# Patient Record
Sex: Male | Born: 1935 | Race: White | Hispanic: No | Marital: Married | Smoking: Never smoker
Health system: Southern US, Community
[De-identification: ages and names within clinical notes are randomized; demographics above are authoritative.]

## PROBLEM LIST (undated history)

## (undated) DIAGNOSIS — D649 Anemia, unspecified: Secondary | ICD-10-CM

## (undated) DIAGNOSIS — M79609 Pain in unspecified limb: Secondary | ICD-10-CM

## (undated) DIAGNOSIS — E039 Hypothyroidism, unspecified: Secondary | ICD-10-CM

## (undated) DIAGNOSIS — R197 Diarrhea, unspecified: Secondary | ICD-10-CM

## (undated) DIAGNOSIS — K5790 Diverticulosis of intestine, part unspecified, without perforation or abscess without bleeding: Secondary | ICD-10-CM

## (undated) DIAGNOSIS — Z8719 Personal history of other diseases of the digestive system: Secondary | ICD-10-CM

## (undated) DIAGNOSIS — N189 Chronic kidney disease, unspecified: Secondary | ICD-10-CM

## (undated) DIAGNOSIS — K4090 Unilateral inguinal hernia, without obstruction or gangrene, not specified as recurrent: Secondary | ICD-10-CM

## (undated) DIAGNOSIS — M1990 Unspecified osteoarthritis, unspecified site: Secondary | ICD-10-CM

## (undated) DIAGNOSIS — I319 Disease of pericardium, unspecified: Secondary | ICD-10-CM

## (undated) DIAGNOSIS — I10 Essential (primary) hypertension: Secondary | ICD-10-CM

## (undated) DIAGNOSIS — E079 Disorder of thyroid, unspecified: Secondary | ICD-10-CM

## (undated) DIAGNOSIS — C801 Malignant (primary) neoplasm, unspecified: Secondary | ICD-10-CM

## (undated) DIAGNOSIS — G8929 Other chronic pain: Secondary | ICD-10-CM

## (undated) DIAGNOSIS — E119 Type 2 diabetes mellitus without complications: Secondary | ICD-10-CM

## (undated) DIAGNOSIS — I639 Cerebral infarction, unspecified: Secondary | ICD-10-CM

## (undated) DIAGNOSIS — E785 Hyperlipidemia, unspecified: Secondary | ICD-10-CM

## (undated) HISTORY — DX: Type 2 diabetes mellitus without complications: E11.9

## (undated) HISTORY — DX: Unspecified osteoarthritis, unspecified site: M19.90

## (undated) HISTORY — PX: SPINE SURGERY: SHX786

## (undated) HISTORY — PX: HERNIA REPAIR: SHX51

## (undated) HISTORY — DX: Pain in unspecified limb: M79.609

## (undated) HISTORY — DX: Hyperlipidemia, unspecified: E78.5

## (undated) HISTORY — PX: CATARACT EXTRACTION: SUR2

## (undated) HISTORY — PX: COLONOSCOPY W/ POLYPECTOMY: SHX1380

## (undated) HISTORY — DX: Disorder of thyroid, unspecified: E07.9

## (undated) HISTORY — PX: EYE SURGERY: SHX253

## (undated) HISTORY — DX: Diverticulosis of intestine, part unspecified, without perforation or abscess without bleeding: K57.90

## (undated) HISTORY — DX: Other chronic pain: G89.29

## (undated) HISTORY — DX: Essential (primary) hypertension: I10

## (undated) HISTORY — PX: TONSILLECTOMY: SUR1361

## (undated) HISTORY — DX: Disease of pericardium, unspecified: I31.9

## (undated) HISTORY — PX: CHOLECYSTECTOMY: SHX55

## (undated) HISTORY — DX: Unilateral inguinal hernia, without obstruction or gangrene, not specified as recurrent: K40.90

## (undated) HISTORY — PX: SPINAL CORD STIMULATOR IMPLANT: SHX2422

---
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
## 1983-06-09 HISTORY — PX: OTHER SURGICAL HISTORY: SHX169

## 1997-06-08 HISTORY — PX: JOINT REPLACEMENT: SHX530

## 1999-01-20 ENCOUNTER — Ambulatory Visit (HOSPITAL_COMMUNITY): Admission: RE | Admit: 1999-01-20 | Discharge: 1999-01-20 | Payer: Self-pay | Admitting: Gastroenterology

## 2002-04-21 ENCOUNTER — Encounter: Admission: RE | Admit: 2002-04-21 | Discharge: 2002-04-21 | Payer: Self-pay | Admitting: Internal Medicine

## 2002-04-21 ENCOUNTER — Encounter: Payer: Self-pay | Admitting: Internal Medicine

## 2004-02-13 ENCOUNTER — Ambulatory Visit (HOSPITAL_COMMUNITY): Admission: RE | Admit: 2004-02-13 | Discharge: 2004-02-13 | Payer: Self-pay | Admitting: Gastroenterology

## 2005-02-16 ENCOUNTER — Encounter: Admission: RE | Admit: 2005-02-16 | Discharge: 2005-02-16 | Payer: Self-pay | Admitting: Neurology

## 2005-08-25 ENCOUNTER — Encounter: Admission: RE | Admit: 2005-08-25 | Discharge: 2005-08-25 | Payer: Self-pay

## 2005-09-14 ENCOUNTER — Encounter: Admission: RE | Admit: 2005-09-14 | Discharge: 2005-09-14 | Payer: Self-pay | Admitting: Neurology

## 2005-09-16 ENCOUNTER — Ambulatory Visit (HOSPITAL_COMMUNITY): Admission: RE | Admit: 2005-09-16 | Discharge: 2005-09-16 | Payer: Self-pay | Admitting: Neurology

## 2005-10-23 ENCOUNTER — Ambulatory Visit: Payer: Self-pay

## 2006-01-18 ENCOUNTER — Encounter: Admission: RE | Admit: 2006-01-18 | Discharge: 2006-01-18 | Payer: Self-pay | Admitting: Gastroenterology

## 2006-03-28 ENCOUNTER — Encounter: Admission: RE | Admit: 2006-03-28 | Discharge: 2006-03-28 | Payer: Self-pay | Admitting: Specialist

## 2006-07-04 ENCOUNTER — Inpatient Hospital Stay (HOSPITAL_COMMUNITY): Admission: EM | Admit: 2006-07-04 | Discharge: 2006-07-06 | Payer: Self-pay | Admitting: Emergency Medicine

## 2006-07-05 ENCOUNTER — Encounter (INDEPENDENT_AMBULATORY_CARE_PROVIDER_SITE_OTHER): Payer: Self-pay | Admitting: Specialist

## 2006-10-22 ENCOUNTER — Ambulatory Visit (HOSPITAL_COMMUNITY): Admission: RE | Admit: 2006-10-22 | Discharge: 2006-10-22 | Payer: Self-pay | Admitting: General Surgery

## 2007-12-22 ENCOUNTER — Encounter: Admission: RE | Admit: 2007-12-22 | Discharge: 2007-12-22 | Payer: Self-pay | Admitting: Neurosurgery

## 2008-02-28 ENCOUNTER — Ambulatory Visit (HOSPITAL_COMMUNITY): Admission: RE | Admit: 2008-02-28 | Discharge: 2008-02-29 | Payer: Self-pay | Admitting: Neurosurgery

## 2010-10-21 NOTE — Op Note (Signed)
NAME:  Matthew Parsons, Matthew Parsons                 ACCOUNT NO.:  [id number]   MEDICAL RECORD NO.:  [id number]          PATIENT TYPE:  OIB   LOCATION:  3528                         FACILITY:  [facility]   PHYSICIAN:  Danae Orleans. Venetia Maxon, M.D.  DATE OF BIRTH:  April 11, 1927   DATE OF PROCEDURE:  02/28/2008  DATE OF DISCHARGE:                               OPERATIVE REPORT   PREOPERATIVE DIAGNOSIS:  Chronic intractable bilateral lower extremity  pain with peripheral neuropathy status post successful stage I spinal  cord stimulator trial.   POSTOPERATIVE DIAGNOSIS:  Chronic intractable bilateral lower extremity  pain with peripheral neuropathy status post successful stage I spinal  cord stimulator trial.   PROCEDURE:  Placement of thoracic spinal cord stimulator via laminectomy  with implantable pulse generator.   SURGEON:  Danae Orleans. Venetia Maxon, MD   ANESTHESIA:  General endotracheal anesthesia.   ESTIMATED BLOOD LOSS:  Minimal.   COMPLICATIONS:  None.   DISPOSITION:  Recovery.   INDICATIONS:  Matthew Parsons is a 77 year old male with chronic intractable  bilateral lower extremity pain.  He underwent successful spinal cord  stimulator trial and had good relief of his bilateral lower extremity  pain with implantable electrode placed at the level of T11-12.  It was  elected to take him to surgery for placement of tripole [employer] spinal  cord stimulator with implantable pulse generator.   PROCEDURE:  Matthew Parsons was brought to the operating room.  Following a  satisfactory uncomplicated induction of general endotracheal anesthesia  and placement of intravenous lines, the patient was placed in a prone  position on chest rolls.  His area of planned incision was marked after  confirming with AP fluoroscopy.  Subsequently, the skin of back was then  prepped and draped in the usual sterile fashion.  Area of planned  incision was infiltrated with local lidocaine.  Incision was made in  midline overlying the spinous  processes and subperiosteal dissection was  performed exposing the T12, L1 level.  Hemilaminectomy was then  performed on the left with exposure of the spinal cord dura and spinal  canal.  There appeared to be some difficulty threading the dummy lead,  as there was a central bar that was obstructing placement of the paddle.  However, with multiple efforts and additional bone removal, I was able  to place the paddle lead directly in the midline overlying the spinal  cord from the inferior aspect of T10 to the overlying T12.  Using a  tripole lead, a pocket incision was created.  The lead extensions were  then tunneled through the subcutaneous tract with tunneler to the pocket  and a planted pulse generator was connected to those leads, inserted  with anchor stitches.  Redundant loop of lead was placed in the spinal  area and the positioning of the lead was confirmed with additional  fluoroscopic image.  The wounds were irrigated, closed with 2-0 and 3-0  Vicryl sutures, and sterile occlusive dressings were placed with  Benzoin, Steri-Strips, Telfa gauze, and tape.  Impedances were checked,  and the device was found to be have acceptable  impedances.  The patient  was taken to recovery having tolerated the procedure well.      Danae Orleans. Venetia Maxon, M.D.  Electronically Signed     JDS/MEDQ  D:  02/28/2008  T:  02/29/2008  Job:  [id number]

## 2010-10-24 NOTE — Op Note (Signed)
NAME:  Matthew Parsons, Matthew Parsons                           ACCOUNT NO.:  [id number]   MEDICAL RECORD NO.:  [id number]                   PATIENT TYPE:  AMB   LOCATION:  ENDO                                 FACILITY:  [facility]   PHYSICIAN:  Bernette Redbird, M.D.                DATE OF BIRTH:  05-24-1927   DATE OF PROCEDURE:  02/13/2004  DATE OF DISCHARGE:                                 OPERATIVE REPORT   PROCEDURE PERFORMED:  Colonoscopy.   ENDOSCOPIST:  Florencia Reasons, M.D.   INDICATIONS FOR PROCEDURE:  The patient is a 77 year old with history of  small colonic adenoma having been removed five years ago.   FINDINGS:  Mild diverticulosis.   DESCRIPTION OF PROCEDURE:  The nature, purpose and risks of the procedure  were familiar to the patient from prior examination and he provided written  consent.  Sedation was fentanyl 50 mcg and Versed 5 mg IV to a level of  moderate sedation without arrhythmia or desaturation.  Digital exam of the  prostate was normal.   The Olympus adjustable tension pediatric video colonoscope was advanced  without significant difficulty to the cecum as identified by visualization  of the appendiceal orifice folds, also what appeared to be the ileocecal  valve and the absence of further lumen. Pullback was then performed.  The  quality of the prep was fair.  There was a fair amount of pea soup or  slightly thicker oatmeal consistency stool scattered here and there along  the length of the colon.  By irrigating this material and diluting it out,  most of it was able to be suctioned up and small pieces of vegetable debris  and so forth, could be moved aside so that it is felt that all areas of the  colon were adequately seen.   There was some mild to moderate sigmoid diverticular change with associated  muscular thickening.  However, no polyps, cancer, colitis or vascular  malformations were seen during this exam.  Retroflexion in the rectum and  reinspection of the  rectum was unremarkable.  No biopsies were obtained.  The patient tolerated the procedure well and there were no apparent  complications.   IMPRESSION:  1.  Prior history of colonic adenoma without worrisome findings on current      examination (N5621).  2.  Sigmoid diverticulosis.   PLAN:  Consider follow-up colonoscopy in five years for surveillance in view  of prior history of colonic adenoma, if the patient remains in good medical  health in the interim.                                               Bernette Redbird, M.D.    RB/MEDQ  D:  02/13/2004  T:  02/13/2004  Job:  [id number]   cc:   Gaspar Garbe, M.D.  [address]  [place]  [place] [address]  Fax: [phone]

## 2010-10-24 NOTE — Op Note (Signed)
NAMESANDY, BLOUCH                 ACCOUNT NO.:  [id number]   MEDICAL RECORD NO.:  [id number]          PATIENT TYPE:  INP   LOCATION:  4730                         FACILITY:  [facility]   PHYSICIAN:  [name], M.D.   DATE OF BIRTH:  11-Mar-1927   DATE OF PROCEDURE:  07/05/2006  DATE OF DISCHARGE:                               OPERATIVE REPORT   PREOPERATIVE DIAGNOSIS:  Acute cholecystitis.   POSTOPERATIVE DIAGNOSIS:  Acute and gangrenous cholecystitis.   PROCEDURE:  Laparoscopic cholecystectomy, intraoperative cholangiogram  with drainage.   SURGEON:  [name], M.D.   ASSISTANT:  Dr. Violeta Gelinas.   ANESTHESIA:  General.   SPECIMENS TO LAB:  Gallbladder with stones.   ESTIMATED BLOOD LOSS:  20 mL.   COMPLICATIONS:  None apparent.  The patient returned to the PACU in good  condition.   INDICATIONS:  Matthew Parsons is a 77 year old diabetic man admitted with  severe epigastric and right upper quadrant pain associated with  persistent nausea and vomiting.  His pain persisted and his leukocytosis  increased over time from 12,000 to 18,000 to 22,000 this morning.  He  has had a low grade temperature of 99, gallbladder ultrasound  demonstrated cholelithiasis.  Because the patient continued to be  exquisitely tender over the right lower quadrant, with his elevated  white count, we decided to proceed with laparoscopic cholecystectomy.  The patient is aware of the risks and potential benefits of surgery and  gives his consent.   PROCEDURE:  Following induction of satisfactory general anesthesia, the  identification of the patient is carried out and this confirmed to be  Matthew Parsons.  The abdomen is prepped and draped to be included in a  sterile operative field.  Open laparoscopy created at the umbilicus with  insertion of a Hasson cannula and insufflation of the peritoneal cavity  to 15 mmHg pressure using carbon dioxide.  Upon exploration, the  gallbladder was inspissated  within a peel of omentum and on taking this  down, there was a black, gangrenous gallbladder that was tensely  distended noted.  Liver edges were otherwise sharp.  Liver surfaces  smooth.  None of the small or large intestine viewed appeared to be  otherwise abnormal.  There was significant amount of greenish brown  fluid around the liver bed going up to the diaphragm and in the  subhepatic space.  Under direct vision, epigastric and flank ports were  placed.  The omental peel was taken down from the gallbladder and the  gallbladder was then aspirated off its contents.  The gallbladder then  grasped and retracted cephalad and the dissection carried down in the  region of the hepatoduodenal ligament allowing isolation of the cystic  duct and cystic artery.  The cystic duct was clipped proximally and  opened.  The cystic duct cholangiogram was carried out by passing a Cook  catheter into the abdomen and into the cystic duct through which one  half-strength Renografin was injected under fluoroscopic control.  The  resulting cholangiogram showed prompt flow of contrast into the duodenum  and up into the  right and left hepatic radicals.  There no filling  defects noted and the caliber of the common bile duct was normal.  The  cholangiocatheter was removed and the cystic duct triply clipped and  then transected, the cystic artery doubly clipped and transected.  The  gallbladder was then dissected free from the liver bed using  electrocautery and maintaining hemostasis throughout the course of  dissection.  The entire gallbladder on its posterior and hepatic Paragas  was gangrenous but was dissected free in its entirety without leaving  any raw mucosal surfaces behind.  At the end of dissection, the liver  bed was checked for hemostasis and noted to be dry.  The gallbladder was  then placed in an endoscopic retrieval pouch.  I then passed a 19-French  Blake drain into the abdomen and brought it out  through one of the more  lateral of the flank ports.  The drain was placed in the subhepatic  space for additional drainage.  The right upper quadrant was then  thoroughly irrigated with multiple aliquots of normal saline and the  pneumoperitoneum then allowed to deflate after the remaining trocars  were removed under direct vision, the sponge, instrument and sharp  counts were doubly verified.  The umbilical wound closed in two layers  using 0 Vicryl and 4-0 Monocryl, epigastric and flank wounds closed with  4-0 Monocryl suture.  All the incisions were then reinforced with Steri-  Strips.  Sterile dressings applied.  Anesthetic reversed.  The patient  removed from the operating room to the recovery room in stable  condition.  He tolerated the procedure well.      [name], M.D.  Electronically Signed     PB/MEDQ  D:  07/05/2006  T:  07/05/2006  Job:  [id number]   cc:   Gaspar Garbe, M.D.

## 2010-10-24 NOTE — H&P (Signed)
NAME:  Matthew Parsons                 ACCOUNT NO.:  [id number]   MEDICAL RECORD NO.:  [id number]          PATIENT TYPE:  INP   LOCATION:  4730                         FACILITY:  [facility]   PHYSICIAN:  Matthew Parsons, M.D.        DATE OF BIRTH:  08-Sep-1926   DATE OF ADMISSION:  07/04/2006  DATE OF DISCHARGE:                              HISTORY & PHYSICAL   CHIEF COMPLAINT:  Abdominal pain with nausea and vomiting.   HISTORY OF PRESENT ILLNESS:  This is a 77 year old Caucasian male with a  long history of diabetes, type 2, severe diabetic neuropathy,  hypertension, large hiatal hernia who presents with sudden onset  centrally located abdominal pain that extends to the suprapubic area as  well as the epigastric area.  This started at approximately 9-10 p.m. on  July 03, 2006, shortly after consuming a meal that consisted  primarily of vegetables with low-fat content.  This was associated with  emesis of a small amount of nonbloody material.  The patient states that  he has had vague abdominal pain during previous nights, but that quickly  resolved and clearly not of the same intensity.  Given the intensity and  severity of his discomfort, he presented to the emergency room via EMS  where he got antiemetics en route.  Pain was only marginally controlled  with Dilaudid in the emergency room. Evaluation in the emergency room  was significant only for white blood cell count of 11.9.  Sodium of 131,  normal LFTs and electrolytes. Hemoglobin 12.9.  He also had a normal  lipase.  CT scan of the abdomen and pelvis with significant for a large  amount of stool, hiatal hernia, and a small, non-incarcerating inguinal  hernia.  The patient, in the emergency room, continued to be  uncomfortable with increased blood pressure in the setting of using IV  Dilaudid despite the latter causing significant sedation.  He is now  being admitted for further evaluation.   REVIEW OF SYSTEMS:  As above but positive  for constipation and straining  to have a bowel movement over the last 2 weeks in the setting of  increasing use of Trileptal and Ultram as prescribed by a specialist for  his diabetic neuropathy. He also has recently changed brand formulations  of his MiraLax.  He denies any fevers, chills, blood per rectum or  urine, change in urine output, chest pain, shortness of breath, new  neurological or musculoskeletal deficits other than that associated with  his diabetic neuropathy.   PROBLEM LIST:  1. Type 2 diabetes mellitus diagnosed in 1985.  2. Diabetic neuropathy.  3. Hypertension.  4. Constipation.  5. Hypothyroidism.  6. Benign prostatic hypertrophy.  7. Gastroesophageal reflux disease with large hiatal hernia.   ALLERGIES:  The patient has allergies to METHYL PREDNISONE and  HYDROCODONE.   SOCIAL HISTORY:  The patient is married greater than 50 years, is a  retired Clinical research associate and denies any tobacco or alcohol abuse.   FAMILY HISTORY:  Negative for GI-related illnesses and positive for both  parents having died in their  90s of age-related complications.  He has  no siblings   LABORATORY EVALUATION:  EKG is pending.   Chest x-ray reveals a hiatal hernia with left base atelectasis,  otherwise negative.   CT of the abdomen and pelvis reveals a large amount of stool, no free  air, large hiatal hernia, and small inguinal hernia that is not  obstructive.   White blood cell count 11.9, hemoglobin 12.9, hematocrit 39.3%, platelet  count 123.  PT 13 seconds.  Urinalysis unremarkable.  Sodium 131,  potassium 4.1, BUN 11, creatinine 0.8, glucose 218.  Liver function  tests normal, lipase 14.  Albumin was 4.0   MEDICATIONS:  1. Aciphex 20 mg p.o. daily.  2. Cozaar 50 mg p.o. daily/  3. Crestor 5 mg p.o. daily started on January 16.  4. Fish oil supplements.  5. Flomax 0.4 mg each evening, started on January 23.  6. Glimepiride 4 mg each day.  7. Lidoderm patches two to three on the  lower extremities for 12 hours      each day.  8. Multivitamins.  9. Synthroid 125 mcg each day.  10.Temazepam 30 mg p.o. q.h.s. p.r.n.  11.Ultram possibly associated with acetaminophen 150 twice a day with      an additional dose of 100 mg during the day.  12.Trileptal 150 mg 3 times a day.  13.Janumet 50/1000 mg either once or twice a day, unclear per the      patient.  14.Glucolax or generic derivatives b.i.d.  15.Iron supplements twice a day.   PHYSICAL EXAMINATION:  GENERAL: We have a Caucasian male lying flat in  bed, alert and oriented x3, answering all questions appropriately but  falling asleep during questioning and during exam in the setting of  using IV Dilaudid as well as complicated by the early morning hours.  VITAL SIGNS: Blood pressure 201/87, pulse 100, respirations 22,  temperature 97.2 degrees Fahrenheit.  HEENT: Sclerae anicteric.  Extraocular movements are intact.  There is  no oropharyngeal lesions.  The mucosa is somewhat dry.  NECK:  Supple.  There is no cervical lymphadenopathy.  No bruits  appreciated.  LUNGS: Clear to auscultation bilaterally.  CARDIOVASCULAR: Reveals regular rate and rhythm with murmur present.  ABDOMEN: Examination reveals a tender abdomen that is moderately tender  without rebound that is centrally located but certainly radiates into  the upper epigastric area as well as the suprapubic area with no rebound  at this time and no localization that is clear.  EXTREMITIES: Pedal pulses are intact.  There is no edema.  NEUROLOGIC: There is a decrease in light touch in lower extremities.   ASSESSMENT/PLAN:  1. Abdominal discomfort in a patient with severe diabetic neuropathy      and longstanding type 2 diabetes mellitus that is not      characteristics for this patient requiring IV Dilaudid for pain      management.  Differential diagnoses include obstipation, gastritis,     and certainly concerning for bowel ischemia given the duration  of      this patient's diabetes. I will plan to admit with IV pain      medications, GI and general surgery consult, and provide      intravenous proton pump inhibitor, bowel regimen, and check stools      for hemoccult  2. Type 2 diabetes mellitus. Will hold Janumet and start sliding scale      insulin with probable need for basal insulin depending on  evaluation. Given the presence of longstanding diabetes as well as      the abdominal pain and nausea and vomiting, will cycle cardiac      enzymes.  3. Hypothyroidism.  Will continue supplementation.  4. Diabetic neuropathy.  Will continue IV pain medications for now and      hold p.o. pain medications.  5. Hypertension. Will restart ARB at slightly higher dose.      Matthew Parsons, M.D.  Electronically Signed     RA/MEDQ  D:  07/04/2006  T:  07/04/2006  Job:  [id number]   cc:   Gaspar Garbe, M.D.  Bernette Redbird, M.D.  Thomas A. Cornett, M.D.

## 2010-10-24 NOTE — Discharge Summary (Signed)
Matthew Parsons, Matthew Parsons                 ACCOUNT NO.:  [id number]   MEDICAL RECORD NO.:  [id number]          PATIENT TYPE:  INP   LOCATION:  4730                         FACILITY:  [facility]   PHYSICIAN:  Gaspar Garbe, M.D.DATE OF BIRTH:  [birth year]   DATE OF ADMISSION:  07/04/2006  DATE OF DISCHARGE:  07/06/2006                               DISCHARGE SUMMARY   FINAL DIAGNOSES:  1. Acute cholecystitis, gangrenous, status post laparoscopic      cholecystectomy on July 05, 2006.  2. Diabetes mellitus type 2.  3. Hypertension.  4. Hyperlipidemia.  5. Benign prostatic hypertrophy.  6. Chronic pain syndrome bilateral lower extremities.  7. Hypothyroidism.  8. Chronic insomnia.   MEDICATIONS:  Aciphex 20 mg p.o. daily, Cozaar 50 mg p.o. daily, Crestor  5 mg p.o. daily, fish oil over-the-counter, Flomax 0.4 mg p.o. at night,  glimepiride 4 mg p.o. daily, Janumet 50/1000 one tablet twice daily,  iron 325 mg twice daily, Trileptal 150 mg p.o. t.i.d., GlycoLax p.r.n.  for constipation, Ultram and Vicodin as prior for pain, Synthroid 125  mcg p.o. daily, temazepam 30 mg p.o. q.h.s. p.r.n. for sleep,  multivitamin, Lidoderm patches as needed for pain.   LABORATORY DATA:  On the day of discharge, show a decreased white count  of 11.1, hemoglobin 12.1, hematocrit 35.6, platelets 219.  LFTs within  normal limits.  Sodium slightly decreased at 132, potassium 3.9, BUN and  creatinine 10 and 0.8 respectively, glucose 128.   PHYSICAL EXAMINATION:  VITAL SIGNS:  Blood pressure 132/72, heart rate  101, respiratory rate 20, temperature 99.4, saturating 96% on 2 liters.  GENERAL:  No acute distress.  HEENT:  Normocephalic, atraumatic.  PERRLA.  EOMI.  ENT within normal  limits.  NECK:  Supple.  No lymphadenopathy, JVD or bruit.  HEART:  Regular rate and rhythm.  No murmur, rub or gallop appreciated.  LUNGS:  Clear to auscultation bilaterally.  ABDOMEN: Status post laparoscopic cholecystectomy,  incision site appears  to be clean.  Covered with Steri-Strips.  No tenderness to palpation,  slightly distended.  JP drain in place.  EXTREMITIES:  No clubbing, cyanosis or edema.   HOSPITAL COURSE:  Mr. Klemp was admitted by my partner, Dr. Clelia Croft, on  July 04, 2006.  He had a consultation per [facility] as  well as with Dr. Matthias Hughs with GI.  He underwent a CT of abdomen and  pelvis which was unremarkable and then later underwent an ultrasound of  his right upper quadrant showing gallstones and evidence of  cholecystitis.  The patient was taken to the OR by Dr. Lurene Shadow at 11 a.m.  on July 05, 2006, and underwent laparoscopic cholecystectomy.  Please  see operative report for details of the surgery.  He did very well  overnight, eating well the next morning and per Dr. Lurene Shadow was cleared  for discharge.  The patient was subsequently discharged midmorning on  July 06, 2006.   FOLLOWUP:  The patient is to follow up with myself as per or routine  care for his diabetes.  Currently has a chronic pian  workup pending with  a diabetologist in [place] with most of the laboratory testing not back  as of yet.  He will follow up with him as needed or as previously  scheduled.  The patient will be contacted by Dr. Danella Penton office  for  post-surgical followup.   DISCHARGE CONDITION:  Stable.      Gaspar Garbe, M.D.  Electronically Signed     RWT/MEDQ  D:  07/06/2006  T:  07/06/2006  Job:  [id number]   cc:   [name], M.D.  Bernette Redbird, M.D.

## 2010-10-24 NOTE — Consult Note (Signed)
NAME:  Matthew Parsons, Matthew Parsons                 ACCOUNT NO.:  [id number]   MEDICAL RECORD NO.:  [id number]          PATIENT TYPE:  INP   LOCATION:  1824                         FACILITY:  [facility]   PHYSICIAN:  Thomas A. Cornett, M.D.DATE OF BIRTH:  [birth year]   DATE OF CONSULTATION:  DATE OF DISCHARGE:                                 CONSULTATION   DOCTOR REQUESTING CONSULTATION:  Dr. Matthias Hughs.   REASON FOR CONSULTATION:  Abdominal pain, nausea, vomiting and hiatal  hernia with concern for possible mesenteric ischemia.   HISTORY OF PRESENT ILLNESS:  The patient is a 77 year old white male  with a history of sudden abdominal pain, which started at 9 o'clock last  night.  The pain was sharp in nature, located in his epigastrium and  radiated down to his umbilicus.  Denied any right lower quadrant or left  lower quadrant pain.  The pain was severe, 10/10.  It is unclear if this  was made worse by eating, but he was watching TV at the time when it hit  him.  He was brought to the emergency department and evaluated by the  emergency department.  Workup revealed an abdominal CT scan, which  showed a large hiatal hernia, which was not new without signs of  volvulus, no free fluid and no abnormality with the small or large bowel  and bilateral inguinal hernia, which was not incarcerated.  No signs of  bowel obstruction.  I was asked to see the patient at the request of Dr.  Matthias Hughs and Dr. Felipa Eth for severe abdominal pain.  There was concern that  he may have mesenteric ischemia and that is why I was asked to see the  patient.   The patient currently is complaining of some mild to moderate abdominal  pain.  He received 4 mg of Dilaudid since 1 a.m., it is now 10:45.  He  is somewhat sleepy, but arousable and answers my questions  appropriately.  Most of his pain is between his umbilicus and xyphoid  process, right in the middle and his rib margins are tender he states as  well.  This is associated with  some nausea and vomiting.  He has  problems with chronic pain due to his peripheral neuropathy secondary to  his type 2 diabetes mellitus and takes pain medicine for that and he has  issues with constipation.  Apparently, he has been to the [facility] in  other places for treatment of his pain, which is very debilitating and  severe to him.  The pain is a 10/10 and occurs in all 4 extremities,  especially his lower extremities and back.  He had a colonoscopy, back  in 2005, which showed some diverticular disease by Dr. Matthias Hughs.  No  history of weight loss or __________ .   PAST MEDICAL HISTORY:  1. Type 2 diabetes mellitus.  2. Chronic pain, secondary to peripheral neuropathy.  3. Hiatal hernia.  4. Hypertension.  5. Hypothyroidism.   PAST SURGICAL HISTORY:  None on his abdomen.   ALLERGIES:  1. HYDROCODONE.  2. METHYLPREDNISOLONE.   FAMILY HISTORY:  Otherwise, noncontributory.   SOCIAL HISTORY:  He is married and lives at home.  No history of tobacco  or alcohol use.   REVIEW OF SYSTEMS:  As stated above.  Otherwise, 15-point review of  systems is negative.   MEDICATIONS:  Include Avapro, Protonix and Synthroid and methadone.   PHYSICAL EXAMINATION:  VITAL SIGNS:  Temperature 97.  Pulse 101.  Blood  pressure 176/60.  Respiratory rate is 20.  GENERAL APPEARANCE:  White male laying in bed in no apparent distress  with minimal stress.  HEENT:  No evidence of scleral icterus.  The mucous membranes are moist.  Oropharynx is clear.  Normocephalic, atraumatic.  NECK:  Supple, nontender without bruit.  Trachea midline.  No  lymphadenopathy.  CHEST:  Chest [place] motion is normal.  PULMONARY:  Lung sounds are clear to auscultation.  CARDIOVASCULAR:  Slight endocardial without rub, murmur or gallop.  Peripheral pulses are 2+ in all 4 extremities with good perfusion.  ABDOMEN:  Slightly distended.  Is soft with some tenderness over both  right and left costal margins to palpation.   There is some discomfort in  the midline between his umbilicus and pubic symphysis.  No evidence of  umbilical hernia.  No peritonitis, rebound or guarding.  He is  distended, but otherwise soft.  Bowel sounds are decreased.  GU:  Penis and phallus are normal.  He has a bilateral inguinal hernia,  which was soft and easily reducible.  EXTREMITIES:  Muscle tone is normal with no significant muscle wasting.  Strength appears normal with excellent range of motion.  NEUROLOGICAL EXAMINATION:  He is alert and oriented x4.  Motor and  sensory function are grossly intact.  History of peripheral neuropathy.   DIAGNOSTIC STUDIES:  Reviewed his abdominal and pelvic CT.  He has a  large hiatal hernia without signs of volvulus.  He has no free fluid.  His small and large bowel appear grossly normal.  He does have a  bilateral inguinal hernia without signs of incarceration.  No evidence  of abdominal aortic aneurysm or signs of pancreatitis.  No signs of  gallstones by CT.   LABORATORY DATA:  White count 11,900 with a left shift, hemoglobin 12.9,  platelet count is 223,000.  Sodium 131, potassium 4.1, CO2 27, BUN 11,  creatinine 0.8, glucose 218.  He had a venous gas analyzed, his pH was  7.38.  His deficit was 2.  Lipase was normal.  Albumin was 4.0.  Chest x-  ray reveals hiatal hernia.   IMPRESSION:  Epigastric abdominal pain, nausea and vomiting.   PLAN:  At this point in time, I do not think he has mesenteric ischemia,  but we will go ahead and repeat his CBC and his ABG to see if he  becoming more acidotic.  Examination does not fit it and he does not  seem to be progressing.  He does have a history of peripheral neuropathy  and he also has significant constipation and I have to wonder if this is  not contributing to his discomfort.  He does take chronic pain  medicines, so he may have a higher tolerance of narcotics for this discomfort.  He has had a couple of small bowel movements, but by  CT he  has quite of bit of stool in his colon.  We will go ahead and repeat his  CBC with diff, ABG and get an LVH to see if there is any signs of  ischemia.  Other  issue could be his hiatal hernia, but usually gastric  ischemia with a hiatal hernia is  secondary to volvulus and is unseen today on CT scan.  At this point in  time, we will go ahead and follow him.  If his condition worsens or  other indicators look suspicious for ischemia, he may require a  laparoscopy and possible laparotomy, but at this point in time, I see no  reason to do that.      Thomas A. Cornett, M.D.  Electronically Signed     TAC/MEDQ  D:  07/04/2006  T:  07/04/2006  Job:  [id number]   cc:   Larina Earthly, M.D.  Bernette Redbird, M.D.

## 2010-10-24 NOTE — Consult Note (Signed)
NAMELADARIAN, BONCZEK                 ACCOUNT NO.:  [id number]   MEDICAL RECORD NO.:  [id number]          PATIENT TYPE:  INP   LOCATION:  1824                         FACILITY:  [facility]   PHYSICIAN:  Bernette Redbird, M.D.   DATE OF BIRTH:  1926/12/20   DATE OF CONSULTATION:  07/04/2006  DATE OF DISCHARGE:                                 CONSULTATION   Dr. 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   Mr. Delbuono is well known to me from outpatient evaluation of chronic  abdominal issues, especially loss of appetite, loss of weight and  anemia.  He also has severe diabetes and diabetic neuropathy, for which  he has been to the [facility] in [place] and more recently to a  specialized physician for that problem at [facility].   With that background, the patient had the fairly abrupt onset of midline  abdominal pain pretty much from the pubic region up to the xiphoid  region yesterday evening after a fairly light supper.  The pain was of  sufficient intensity to prompt him to come to the emergency room in an  ambulance.  It is noteworthy that the patient has never had pain similar  to this before, nor has he been one to cry wolf with respect to  recurrent abdominal symptoms in the past.   In the emergency room, evaluation was nonspecific, with a borderline  white count of 11,900, normal liver chemistries, normal amylase, and a  CT scan which was negative for any obvious source of abdominal pain.  Dr. Felipa Eth called and asked if I could provide further input.   The patient had some dry heaves when the pain first began, but since  then there is been no ongoing problem with nausea or vomiting.  There is  no problem with fevers or chills.  He tends to run somewhat constipated  due to multiple pain medications, but he did have to small bowel  movements  yesterday.   PAST MEDICAL HISTORY:   ALLERGIES:  HYDROCODONE.   OUTPATIENT MEDICATIONS:  He was recently switched to Janumet which is a  combination pill for diabetes.   MEDICAL ILLNESSES:  Include:  1. History of a large hiatal hernia.  2. Reflux.  3. Hypertension.  4. Diabetes of about 20 years duration.  5. Severe peripheral neuropathy.  6. A colonoscopy 2-1/2 years ago showed mild diverticulosis; he had a      prior history of colonic adenoma having been removed.   FAMILY HISTORY:  Negative for gallbladder disease or other GI illnesses.   SOCIAL HISTORY:  Married, retired [employer] consultant. Devoted and concerned wife  is at bedside.   PHYSICAL EXAMINATION:  GENERAL:  The patient at this time, following  Dilaudid, is not in acute distress unless he tries to move around on the  gurney.  The pain has lateralized to both infracostal areas primarily.  HEENT: He is anicteric without frank pallor.  VITAL SIGNS: He is afebrile.  CHEST: The chest is clear.  CARDIAC: The heart has a regular rhythm and no gallops, rubs or murmurs  appreciated.  ABDOMEN: Has reproducible nd fairly impressive right upper quadrant  tenderness with some guarding, no obvious rebound.  The remainder of  abdomen is quite benign.  RECTAL:  Shows no masses and some moderately firm stool at the apex of  the rectal ampulla which was submitted to the lab for analysis and has  come back negative for occult blood   LABORATORY DATA:  Blood gas shows pH 7.43, pO2 79, pCO2 37. White count  on admission to the ER was 11,900, hemoglobin 12.9, platelets 223,000.  INR 1.0.  Chemistry panel pertinent for BUN 11, creatinine 0.8. Normal  liver chemistries. Normal lipase.  CK normal.  Troponin I normal.   CT scan:  I reviewed the CT scan carefully with Dr. Audie Pinto of  radiology.  We do not see any evidence for small intestinal ischemia  such as edema, nor any significant obstruction of the celiac axis or  more especially the  SMA.  There was a little bit of calcification at the  origin of the celiac axis.  No perforation, tumor, or bowel obstruction  are appreciated, and there is no evidence of pancreatitis.   IMPRESSION:  Abrupt onset of diffuse abdominal pain, persisting, now  lateralizing in both infracostal areas with significant right upper  quadrant tenderness, CT unrevealing, mild hypoxemia on blood gas but no  acidosis or hyperventilation.  Note that the patient reports no  shortness of breath.   DISCUSSION AND PLAN:  Given the unclear nature of the underlying  problem, but also given this patient's potential for significant  problems in view of his underlying diabetes and advanced age, and in  view of the fact that he is not ordinarily bothered by this kind of  symptom, I am worried about some sort of process going on, even though I  cannot define what it is at the present time.  I would wonder about of  gallstone attack or I still wonder about the possibility of mesenteric  ischemia.  A pulmonary embolism would not likely present in this fashion  given the absence of shortness breath, hyperventilation or chest pain.  I agree with Dr. Luisa Hart that the patient should have an ultrasound to  look for gallstones and an updated CBC to see what the trend is on his  white count.   I appreciate the opportunity of seeing this patient in consultation.           ______________________________  Bernette Redbird, M.D.     RB/MEDQ  D:  07/04/2006  T:  07/04/2006  Job:  [id number]   cc:   Geoffry Paradise, M.D.  Thomas A. Cornett, M.D.

## 2010-10-24 NOTE — Op Note (Signed)
NAME:  Matthew Parsons, Matthew Parsons                 ACCOUNT NO.:  [id number]   MEDICAL RECORD NO.:  [id number]          PATIENT TYPE:  AMB   LOCATION:  SDS                          FACILITY:  [facility]   PHYSICIAN:  [name], M.D.   DATE OF BIRTH:  [birth year]   DATE OF PROCEDURE:  10/22/2006  DATE OF DISCHARGE:                               OPERATIVE REPORT   PREOPERATIVE DIAGNOSIS:  Right inguinal hernia.   POSTOPERATIVE DIAGNOSIS:  Direct right inguinal hernia.   PROCEDURE:  Repair right inguinal hernia with mesh.   SURGEON:  [name], M.D.   ASSISTANT:  Magnus Ivan, RNFA   ANESTHESIA:  General.   SPECIMENS:  None.   COMPLICATIONS:  None.   The patient returned to the PACU in excellent condition.   INDICATION:  The patient is a 77 year old man with a symptomatic right  sided inguinal hernia who wishes to have this repaired.  He comes to the  operating room after the risks and potential benefits have been  discussed, all questions answered and consent obtained.   PROCEDURE:  The patient is positioned supinely, and following the  induction of satisfactory general anesthesia, the lower abdomen is  prepped and draped to be included in a sterile operative field.  The  lower abdominal crease is infiltrated with 0.5% Marcaine with  epinephrine and a transverse incision is made in the lower abdominal  crease dissecting down to the external oblique aponeurosis.  The  external oblique aponeurosis is opened up through the external inguinal  ring and the spermatic cord is elevated and held with the Penrose drain.  Having a large direct sac is closely adherent to the spermatic cord and  this is dissected free.  There was no indirect sac noted on exploration  of the spermatic cord.  The hernia sac was freed and reduced into the  retroperitoneum and the inguinal floor is repaired by an onlay patch of  polypropylene mesh sewn in at the pubic tubercle and with 2-0 Novafil  and continued in a  running suture up along the conjoint tendon to the  internal ring and again from the pubic tubercle carrying the 2-0 Novafil  suture up along the shelving edge of Poupart's ligament to the internal  ring.  The mesh is split so as to allow normal protrusion of the  spermatic cord.  The tails of the mesh as sutured down behind the cord  to form a new internal ring.  The new internal ring was noted to easily  admit the tip of a hemostat and there was no evidence of compression  against the cord.  Sponge and instrument counts were then verified.  All  areas of dissection were checked for hemostasis and noted to be dry.  The external oblique aponeurosis was closed over the cord,  reapproximating the external ring with a running suture of 2-0 Vicryl.  The Scarpa's fascia was then closed with 3-0 Vicryl and the skin closed  with 4-0 Monocryl.  A sterile dressing is applied, the anesthetic  reversed and the patient removed from the operating room to the  recovery  room in stable condition.  He tolerated the procedure well.      [name], M.D.  Electronically Signed     PB/MEDQ  D:  10/22/2006  T:  10/22/2006  Job:  [id number]

## 2011-01-26 ENCOUNTER — Other Ambulatory Visit (HOSPITAL_COMMUNITY): Payer: Self-pay | Admitting: Neurosurgery

## 2011-01-26 ENCOUNTER — Encounter (HOSPITAL_COMMUNITY)
Admission: RE | Admit: 2011-01-26 | Discharge: 2011-01-26 | Disposition: A | Discharge status: Home / Self Care | Payer: Medicare Other | Source: Ambulatory Visit | Attending: Neurosurgery | Admitting: Neurosurgery

## 2011-01-26 ENCOUNTER — Ambulatory Visit (HOSPITAL_COMMUNITY)
Admission: RE | Admit: 2011-01-26 | Discharge: 2011-01-26 | Disposition: A | Discharge status: Home / Self Care | Payer: Medicare Other | Source: Ambulatory Visit | Attending: Neurosurgery | Admitting: Neurosurgery

## 2011-01-26 DIAGNOSIS — G8929 Other chronic pain: Secondary | ICD-10-CM

## 2011-01-26 DIAGNOSIS — Z01810 Encounter for preprocedural cardiovascular examination: Secondary | ICD-10-CM | POA: Insufficient documentation

## 2011-01-26 DIAGNOSIS — Z01818 Encounter for other preprocedural examination: Secondary | ICD-10-CM | POA: Insufficient documentation

## 2011-01-26 DIAGNOSIS — Z01812 Encounter for preprocedural laboratory examination: Secondary | ICD-10-CM | POA: Insufficient documentation

## 2011-01-26 LAB — BASIC METABOLIC PANEL
BUN: 12 mg/dL (ref 6–23)
CO2: 31 mEq/L (ref 19–32)
Chloride: 102 mEq/L (ref 96–112)
Creatinine, Ser: 0.83 mg/dL (ref 0.50–1.35)
Glucose, Bld: 164 mg/dL — ABNORMAL HIGH (ref 70–99)
Potassium: 4.6 mEq/L (ref 3.5–5.1)
Sodium: 140 mEq/L (ref 135–145)

## 2011-01-26 LAB — SURGICAL PCR SCREEN: MRSA, PCR: NEGATIVE

## 2011-01-26 LAB — CBC
HCT: 41.6 % (ref 39.0–52.0)
RBC: 5.06 MIL/uL (ref 4.22–5.81)
WBC: 8.4 10*3/uL (ref 4.0–10.5)

## 2011-02-06 ENCOUNTER — Ambulatory Visit (HOSPITAL_COMMUNITY)
Admission: RE | Admit: 2011-02-06 | Discharge: 2011-02-06 | Disposition: A | Discharge status: Home / Self Care | Payer: Medicare Other | Source: Ambulatory Visit | Attending: Neurosurgery | Admitting: Neurosurgery

## 2011-02-06 ENCOUNTER — Ambulatory Visit (HOSPITAL_COMMUNITY): Payer: Medicare Other

## 2011-02-06 DIAGNOSIS — Z01818 Encounter for other preprocedural examination: Secondary | ICD-10-CM | POA: Insufficient documentation

## 2011-02-06 DIAGNOSIS — Z01810 Encounter for preprocedural cardiovascular examination: Secondary | ICD-10-CM | POA: Insufficient documentation

## 2011-02-06 DIAGNOSIS — Z462 Encounter for fitting and adjustment of other devices related to nervous system and special senses: Secondary | ICD-10-CM | POA: Insufficient documentation

## 2011-02-06 DIAGNOSIS — G8929 Other chronic pain: Secondary | ICD-10-CM | POA: Insufficient documentation

## 2011-02-06 DIAGNOSIS — E119 Type 2 diabetes mellitus without complications: Secondary | ICD-10-CM | POA: Insufficient documentation

## 2011-02-06 DIAGNOSIS — M79609 Pain in unspecified limb: Secondary | ICD-10-CM | POA: Insufficient documentation

## 2011-02-06 DIAGNOSIS — Z01812 Encounter for preprocedural laboratory examination: Secondary | ICD-10-CM | POA: Insufficient documentation

## 2011-02-06 LAB — GLUCOSE, CAPILLARY: Glucose-Capillary: 103 mg/dL — ABNORMAL HIGH (ref 70–99)

## 2011-02-19 NOTE — Op Note (Signed)
  [name], [name]                 ACCOUNT NO.:  [id number]  MEDICAL RECORD NO.:  [id number]  LOCATION:  SDSC                         FACILITY:  [facility]  PHYSICIAN:  Danae Orleans. Venetia Maxon, M.D.  DATE OF BIRTH:  1927-03-11  DATE OF PROCEDURE:  02/06/2011 DATE OF DISCHARGE:                              OPERATIVE REPORT   PREOPERATIVE DIAGNOSIS:  Depleted implantable pulse generator for spinal cord stimulator.  POSTOPERATIVE DIAGNOSIS:  Depleted implantable pulse generator for spinal cord stimulator.  PROCEDURE:  Replacement of implantable pulse generator.  SURGEON:  Danae Orleans. Venetia Maxon, MD  ASSISTANT:  Georgiann Cocker, RN  ANESTHESIA:  General endotracheal anesthesia.  ESTIMATED BLOOD LOSS:  Minimal.  COMPLICATIONS:  None.  DISPOSITION:  Recovery.  INDICATIONS:  Matthew Parsons is an 77 year old man with chronic intractable bilateral lower extremity pain who had a spinal cord stimulator, which was working well for him.  Had a spinal cord stimulator with surgically implanted lead and a non-retractable pulse generator.  This was depleted.  It was elected to take the patient to surgery for replacement of implantable pulse generator.  PROCEDURE:  [name] was brought to the operating room.  Following satisfactory and uncomplicated induction of general endotracheal anesthesia, the patient was turned to prone position on the operating table on chest rolls.  His back was prepped and draped in usual sterile fashion.  Incision was infiltrated with local lidocaine.  Incision was made overlying the implanted pulse generator and this was brought out of the incision.  The leads were disconnected.  A RestoreSensor and Medtronic implantable pulse generator was then placed and locked down. The electrodes were placed within the IBG and locked appropriately.  The single stitch was used to anchor the pulse generator.  The impedances were checked and found to be acceptable.  The wounds were then closed  with 2-0 and 3-0 Vicryl sutures and sterile occlusive dressing was placed.  The patient was taken to recovery having tolerated the procedure well.     Danae Orleans. Venetia Maxon, M.D.     JDS/MEDQ  D:  02/06/2011  T:  02/06/2011  Job:  [id number]  Electronically Signed by Maeola Harman M.D. on 02/19/2011 02:47:13 PM

## 2011-03-09 LAB — GLUCOSE, CAPILLARY
Glucose-Capillary: 146 — ABNORMAL HIGH
Glucose-Capillary: 159 — ABNORMAL HIGH
Glucose-Capillary: 169 — ABNORMAL HIGH

## 2011-03-09 LAB — CBC
HCT: 42.9
MCHC: 34
MCV: 82.3
RBC: 5.22
WBC: 6

## 2011-08-20 DIAGNOSIS — E785 Hyperlipidemia, unspecified: Secondary | ICD-10-CM | POA: Diagnosis not present

## 2011-08-20 DIAGNOSIS — M79609 Pain in unspecified limb: Secondary | ICD-10-CM | POA: Diagnosis not present

## 2011-08-20 DIAGNOSIS — E119 Type 2 diabetes mellitus without complications: Secondary | ICD-10-CM | POA: Diagnosis not present

## 2011-08-20 DIAGNOSIS — D509 Iron deficiency anemia, unspecified: Secondary | ICD-10-CM | POA: Diagnosis not present

## 2011-08-20 DIAGNOSIS — R42 Dizziness and giddiness: Secondary | ICD-10-CM | POA: Diagnosis not present

## 2011-08-20 DIAGNOSIS — E039 Hypothyroidism, unspecified: Secondary | ICD-10-CM | POA: Diagnosis not present

## 2011-08-20 DIAGNOSIS — F411 Generalized anxiety disorder: Secondary | ICD-10-CM | POA: Diagnosis not present

## 2011-08-20 DIAGNOSIS — I10 Essential (primary) hypertension: Secondary | ICD-10-CM | POA: Diagnosis not present

## 2012-01-07 DIAGNOSIS — E11329 Type 2 diabetes mellitus with mild nonproliferative diabetic retinopathy without macular edema: Secondary | ICD-10-CM | POA: Diagnosis not present

## 2012-01-07 DIAGNOSIS — E1139 Type 2 diabetes mellitus with other diabetic ophthalmic complication: Secondary | ICD-10-CM | POA: Diagnosis not present

## 2012-02-29 DIAGNOSIS — E785 Hyperlipidemia, unspecified: Secondary | ICD-10-CM | POA: Diagnosis not present

## 2012-02-29 DIAGNOSIS — E119 Type 2 diabetes mellitus without complications: Secondary | ICD-10-CM | POA: Diagnosis not present

## 2012-02-29 DIAGNOSIS — E039 Hypothyroidism, unspecified: Secondary | ICD-10-CM | POA: Diagnosis not present

## 2012-02-29 DIAGNOSIS — Z125 Encounter for screening for malignant neoplasm of prostate: Secondary | ICD-10-CM | POA: Diagnosis not present

## 2012-03-07 DIAGNOSIS — E119 Type 2 diabetes mellitus without complications: Secondary | ICD-10-CM | POA: Diagnosis not present

## 2012-03-07 DIAGNOSIS — I10 Essential (primary) hypertension: Secondary | ICD-10-CM | POA: Diagnosis not present

## 2012-03-07 DIAGNOSIS — Z0000 Encounter for general adult medical examination without abnormal findings: Secondary | ICD-10-CM | POA: Diagnosis not present

## 2012-03-07 DIAGNOSIS — Z23 Encounter for immunization: Secondary | ICD-10-CM | POA: Diagnosis not present

## 2012-03-07 DIAGNOSIS — Z125 Encounter for screening for malignant neoplasm of prostate: Secondary | ICD-10-CM | POA: Diagnosis not present

## 2012-03-07 DIAGNOSIS — D509 Iron deficiency anemia, unspecified: Secondary | ICD-10-CM | POA: Diagnosis not present

## 2012-03-08 DIAGNOSIS — Z1212 Encounter for screening for malignant neoplasm of rectum: Secondary | ICD-10-CM | POA: Diagnosis not present

## 2012-03-24 ENCOUNTER — Encounter (INDEPENDENT_AMBULATORY_CARE_PROVIDER_SITE_OTHER): Payer: Self-pay | Admitting: General Surgery

## 2012-03-24 ENCOUNTER — Ambulatory Visit (INDEPENDENT_AMBULATORY_CARE_PROVIDER_SITE_OTHER): Payer: Medicare Other | Admitting: General Surgery

## 2012-03-24 VITALS — BP 124/66 | HR 92 | Temp 97.3°F | Resp 16 | Ht 68.0 in | Wt 163.6 lb

## 2012-03-24 DIAGNOSIS — K4090 Unilateral inguinal hernia, without obstruction or gangrene, not specified as recurrent: Secondary | ICD-10-CM

## 2012-03-24 NOTE — Progress Notes (Signed)
Patient ID: Matthew Parsons, male   DOB: 05/03/1927, 78 y.o.   MRN: [id number]  Chief Complaint  Patient presents with  . Pre-op Exam    eval [facility]    HPI Matthew Parsons is a 78 y.o. male.   HPI  This patient is referred by Dr. Tisovec for evaluation of a reducible left inguinal hernia. He says that he has a history of a prior right inguinal hernia repair in 2008 and has not had any issues on that side since then. He noticed a bulge in the left groin about one to 2 years ago and he says it does not cause him any symptoms or discomfort but it is increasing in size and when he brought this up to his primary physician, he recommended repair. He denies any obstructive symptoms and denies any nausea or vomiting. He does take stool softener for his bowels. He denies any heart conditions but he is diabetic.  History reviewed. No pertinent past medical history.  History reviewed. No pertinent past surgical history.  History reviewed. No pertinent family history.  Social History History  Substance Use Topics  . Smoking status: Never Smoker   . Smokeless tobacco: Not on file  . Alcohol Use: No    Allergies  Allergen Reactions  . Hydrocodone     "makes me crazy"    Current Outpatient Prescriptions  Medication Sig Dispense Refill  . alfuzosin (UROXATRAL) 10 MG 24 hr tablet Take 10 mg by mouth daily.      . aspirin 81 MG tablet Take 81 mg by mouth daily.      . dutasteride (AVODART) 0.5 MG capsule Take 0.5 mg by mouth daily.      . fentaNYL (DURAGESIC - DOSED MCG/HR) 25 MCG/HR Place 1 patch onto the skin every 3 (three) days.      . levothyroxine (SYNTHROID, LEVOTHROID) 112 MCG tablet Take 112 mcg by mouth daily.      . rosuvastatin (CRESTOR) 5 MG tablet Take 5 mg by mouth daily.      . sitaGLIPtan-metformin (JANUMET) 50-1000 MG per tablet Take 1 tablet by mouth 2 (two) times daily with a meal.      . temazepam (RESTORIL) 30 MG capsule Take 30 mg by mouth at bedtime as needed.      . traMADol  (ULTRAM) 50 MG tablet Take 50 mg by mouth 3 (three) times daily.        Review of Systems Review of Systems All other review of systems negative or noncontributory except as stated in the HPI See clinic intake form Blood pressure 124/66, pulse 92, temperature 97.3 F (36.3 C), temperature source Temporal, resp. rate 16, height 5' 8" (1.727 m), weight 163 lb 9.6 oz (74.208 kg).  Physical Exam Physical Exam Physical Exam  Vitals reviewed. Constitutional: He is oriented to person, place, and time. He appears well-developed and well-nourished. No distress.  HENT:  Head: Normocephalic and atraumatic.  Mouth/Throat: No oropharyngeal exudate.  Eyes: Conjunctivae and EOM are normal. Pupils are equal, round, and reactive to light. Right eye exhibits no discharge. Left eye exhibits no discharge. No scleral icterus.  Neck: Normal range of motion. No tracheal deviation present.  Cardiovascular: Normal rate, regular rhythm and normal heart sounds.   Pulmonary/Chest: Effort normal and breath sounds normal. No stridor. No respiratory distress. He has no wheezes. He has no rales. He exhibits no tenderness.  Abdominal: Soft. Bowel sounds are normal. He exhibits no distension and no mass. There is no   tenderness. There is no rebound and no guarding. he is a well-healed surgical scar on the right. There is no evidence of recurrent hernia on the right. He does have a large inguinal bulge on the left which is reducible and nontender. This is consistent with a reducible left inguinal hernia. Musculoskeletal: Normal range of motion. He exhibits no edema and no tenderness.  Neurological: He is alert and oriented to person, place, and time.  Skin: Skin is warm and dry. No rash noted. He is not diaphoretic. No erythema. No pallor.  Psychiatric: He has a normal mood and affect. His behavior is normal. Judgment and thought content normal.    Data Reviewed  Assessment    Reducible left internal hernia He does  have an asymptomatic but increasing left inguinal hernia and he would like to have this repaired. We discussed the options of a watchful waiting versus surgery both open and laparoscopic.he would like to proceed with inguinal hernia repair.  I think that open repair would probably be the best for this patient given the fact that he is a fairly large hernia and this would give us more anesthetic options as well. We discussed the procedure and the risks and benefits and he would like to proceed with hernia surgery. We discussed the risks of infection, bleeding, pain, scarring, recurrence, nerve injury and injury to the vas deferens or testicles, as well as the risks of anesthesia and he expressed understanding and would like to proceed.   Plan    We will proceed with open left inguinal hernia repair when available.       Jacie Tristan DAVID 03/24/2012, 12:51 PM    

## 2012-03-30 ENCOUNTER — Encounter (HOSPITAL_COMMUNITY): Payer: Self-pay | Admitting: Pharmacy Technician

## 2012-04-01 NOTE — Patient Instructions (Addendum)
[name]  04/01/2012   Your procedure is scheduled on:  04/06/12 0830am-1000am  Report to [facility] at 0630 AM.  Call this number if you have problems the morning of surgery: [phone]   Remember:   Do not eat food:After Midnight.  May have clear liquids:until Midnight .    Take these medicines the morning of surgery with A SIP OF WATER:    Do not wear jewelry,.  Do not wear lotions, powders, or perfumes.   . Men may shave face and neck.  Do not bring valuables to the hospital.  Contacts, dentures or bridgework may not be worn into surgery.  Leave suitcase in the car. After surgery it may be brought to your room.  For patients admitted to the hospital, checkout time is 11:00 AM the day of discharge.   SEE CHG INSTRUCTION SHEET   Please read over the following fact sheets that you were given: MRSA Information, coughing and deep breathing exercises, leg exercises

## 2012-04-04 ENCOUNTER — Encounter (HOSPITAL_COMMUNITY)
Admission: RE | Admit: 2012-04-04 | Discharge: 2012-04-04 | Disposition: A | Discharge status: Home / Self Care | Payer: Medicare Other | Source: Ambulatory Visit | Attending: General Surgery | Admitting: General Surgery

## 2012-04-04 ENCOUNTER — Ambulatory Visit (HOSPITAL_COMMUNITY)
Admission: RE | Admit: 2012-04-04 | Discharge: 2012-04-04 | Disposition: A | Discharge status: Home / Self Care | Payer: Medicare Other | Source: Ambulatory Visit | Attending: General Surgery | Admitting: General Surgery

## 2012-04-04 ENCOUNTER — Encounter (HOSPITAL_COMMUNITY): Payer: Self-pay

## 2012-04-04 VITALS — BP 135/61 | HR 71 | Temp 97.6°F | Resp 16 | Ht 68.0 in | Wt 159.0 lb

## 2012-04-04 DIAGNOSIS — K449 Diaphragmatic hernia without obstruction or gangrene: Secondary | ICD-10-CM | POA: Diagnosis not present

## 2012-04-04 DIAGNOSIS — K4090 Unilateral inguinal hernia, without obstruction or gangrene, not specified as recurrent: Secondary | ICD-10-CM | POA: Diagnosis not present

## 2012-04-04 DIAGNOSIS — Z01812 Encounter for preprocedural laboratory examination: Secondary | ICD-10-CM | POA: Diagnosis not present

## 2012-04-04 DIAGNOSIS — Z01810 Encounter for preprocedural cardiovascular examination: Secondary | ICD-10-CM | POA: Diagnosis not present

## 2012-04-04 DIAGNOSIS — Z01818 Encounter for other preprocedural examination: Secondary | ICD-10-CM | POA: Insufficient documentation

## 2012-04-04 HISTORY — DX: Cerebral infarction, unspecified: I63.9

## 2012-04-04 HISTORY — DX: Hypothyroidism, unspecified: E03.9

## 2012-04-04 HISTORY — DX: Malignant (primary) neoplasm, unspecified: C80.1

## 2012-04-04 HISTORY — DX: Chronic kidney disease, unspecified: N18.9

## 2012-04-04 HISTORY — DX: Anemia, unspecified: D64.9

## 2012-04-04 HISTORY — DX: Personal history of other diseases of the digestive system: Z87.19

## 2012-04-04 LAB — BASIC METABOLIC PANEL
BUN: 11 mg/dL (ref 6–23)
Calcium: 9.8 mg/dL (ref 8.4–10.5)
Creatinine, Ser: 0.69 mg/dL (ref 0.50–1.35)
GFR calc Af Amer: >90 mL/min (ref >90)
GFR calc non Af Amer: 85 mL/min — ABNORMAL LOW (ref >90)
Glucose, Bld: 194 mg/dL — ABNORMAL HIGH (ref 70–99)

## 2012-04-04 LAB — CBC
HCT: 40.8 % (ref 39.0–52.0)
Hemoglobin: 13.4 g/dL (ref 13.0–17.0)
MCH: 26.6 pg (ref 26.0–34.0)
MCHC: 32.8 g/dL (ref 30.0–36.0)
RDW: 13.8 % (ref 11.5–15.5)

## 2012-04-05 ENCOUNTER — Telehealth (INDEPENDENT_AMBULATORY_CARE_PROVIDER_SITE_OTHER): Payer: Self-pay | Admitting: General Surgery

## 2012-04-05 NOTE — Telephone Encounter (Signed)
Wife calling in to ask about dietary and physical restrictions to expect following husband's (open) hernia repair.  She states he is "head-strong and stubborn," "won't listen to anybody" and "needs a specific, written list of what he can and cannot do and for how long or he'll hurt himself."  Reassured wife we will be giving them instructions at discharge from the hospital.

## 2012-04-06 ENCOUNTER — Encounter (HOSPITAL_COMMUNITY): Payer: Self-pay | Admitting: *Deleted

## 2012-04-06 ENCOUNTER — Encounter (HOSPITAL_COMMUNITY)
Admission: RE | Disposition: A | Discharge status: Home / Self Care | Payer: Self-pay | Source: Ambulatory Visit | Attending: General Surgery

## 2012-04-06 ENCOUNTER — Encounter (HOSPITAL_COMMUNITY): Payer: Self-pay | Admitting: Registered Nurse

## 2012-04-06 ENCOUNTER — Ambulatory Visit (HOSPITAL_COMMUNITY): Payer: Medicare Other | Admitting: Registered Nurse

## 2012-04-06 ENCOUNTER — Ambulatory Visit (HOSPITAL_COMMUNITY)
Admission: RE | Admit: 2012-04-06 | Discharge: 2012-04-06 | Disposition: A | Discharge status: Home / Self Care | Payer: Medicare Other | Source: Ambulatory Visit | Attending: General Surgery | Admitting: General Surgery

## 2012-04-06 DIAGNOSIS — K449 Diaphragmatic hernia without obstruction or gangrene: Secondary | ICD-10-CM | POA: Diagnosis not present

## 2012-04-06 DIAGNOSIS — G8929 Other chronic pain: Secondary | ICD-10-CM | POA: Diagnosis not present

## 2012-04-06 DIAGNOSIS — Z79899 Other long term (current) drug therapy: Secondary | ICD-10-CM | POA: Insufficient documentation

## 2012-04-06 DIAGNOSIS — E119 Type 2 diabetes mellitus without complications: Secondary | ICD-10-CM | POA: Diagnosis not present

## 2012-04-06 DIAGNOSIS — I10 Essential (primary) hypertension: Secondary | ICD-10-CM | POA: Insufficient documentation

## 2012-04-06 DIAGNOSIS — Z8673 Personal history of transient ischemic attack (TIA), and cerebral infarction without residual deficits: Secondary | ICD-10-CM | POA: Diagnosis not present

## 2012-04-06 DIAGNOSIS — K4090 Unilateral inguinal hernia, without obstruction or gangrene, not specified as recurrent: Secondary | ICD-10-CM | POA: Diagnosis not present

## 2012-04-06 HISTORY — PX: INGUINAL HERNIA REPAIR: SHX194

## 2012-04-06 HISTORY — PX: INSERTION OF MESH: SHX5868

## 2012-04-06 LAB — GLUCOSE, CAPILLARY
Glucose-Capillary: 133 mg/dL — ABNORMAL HIGH (ref 70–99)
Glucose-Capillary: 110 mg/dL — ABNORMAL HIGH (ref 70–99)

## 2012-04-06 SURGERY — REPAIR, HERNIA, INGUINAL, ADULT
Anesthesia: General | Site: Groin | Laterality: Left | Wound class: Clean

## 2012-04-06 MED ORDER — MEPERIDINE HCL 50 MG/ML IJ SOLN: 6.2500 mg | INTRAMUSCULAR | Status: DC | PRN

## 2012-04-06 MED ORDER — LIDOCAINE HCL (CARDIAC) 20 MG/ML IV SOLN
INTRAVENOUS | Status: DC | PRN
  Administered 2012-04-06: 60 mg via INTRAVENOUS

## 2012-04-06 MED ORDER — BUPIVACAINE HCL (PF) 0.25 % IJ SOLN
INTRAMUSCULAR | Status: DC | PRN
  Administered 2012-04-06: 40 mL

## 2012-04-06 MED ORDER — FENTANYL CITRATE 0.05 MG/ML IJ SOLN
INTRAMUSCULAR | Status: DC | PRN
  Administered 2012-04-06: 50 ug via INTRAVENOUS

## 2012-04-06 MED ORDER — PROMETHAZINE HCL 25 MG/ML IJ SOLN: 6.2500 mg | INTRAMUSCULAR | Status: DC | PRN

## 2012-04-06 MED ORDER — LACTATED RINGERS IV SOLN: INTRAVENOUS | Status: DC

## 2012-04-06 MED ORDER — FENTANYL CITRATE 0.05 MG/ML IJ SOLN: 25.0000 ug | INTRAMUSCULAR | Status: DC | PRN

## 2012-04-06 MED ORDER — CEFAZOLIN SODIUM-DEXTROSE 2-3 GM-% IV SOLR
2.0000 g | INTRAVENOUS | Status: AC
  Administered 2012-04-06: 2 g via INTRAVENOUS

## 2012-04-06 MED ORDER — ONDANSETRON HCL 4 MG/2ML IJ SOLN
INTRAMUSCULAR | Status: DC | PRN
  Administered 2012-04-06: 4 mg via INTRAVENOUS

## 2012-04-06 MED ORDER — ROCURONIUM BROMIDE 100 MG/10ML IV SOLN
INTRAVENOUS | Status: DC | PRN
  Administered 2012-04-06: 30 mg via INTRAVENOUS
  Administered 2012-04-06: 5 mg via INTRAVENOUS

## 2012-04-06 MED ORDER — ACETAMINOPHEN 10 MG/ML IV SOLN
INTRAVENOUS | Status: DC | PRN
  Administered 2012-04-06: 1000 mg via INTRAVENOUS

## 2012-04-06 MED ORDER — PROPOFOL 10 MG/ML IV BOLUS
INTRAVENOUS | Status: DC | PRN
  Administered 2012-04-06: 100 mg via INTRAVENOUS

## 2012-04-06 MED ORDER — NEOSTIGMINE METHYLSULFATE 1 MG/ML IJ SOLN
INTRAMUSCULAR | Status: DC | PRN
  Administered 2012-04-06: 4 mg via INTRAVENOUS

## 2012-04-06 MED ORDER — LACTATED RINGERS IV SOLN
INTRAVENOUS | Status: DC | PRN
  Administered 2012-04-06: 08:00:00 via INTRAVENOUS

## 2012-04-06 MED ORDER — LIDOCAINE-EPINEPHRINE 1 %-1:100000 IJ SOLN
INTRAMUSCULAR | Status: DC | PRN
  Administered 2012-04-06: 40 mL

## 2012-04-06 MED ORDER — OXYCODONE-ACETAMINOPHEN 5-325 MG PO TABS: 1.0000 | ORAL_TABLET | ORAL | Status: DC | PRN

## 2012-04-06 MED ORDER — GLYCOPYRROLATE 0.2 MG/ML IJ SOLN
INTRAMUSCULAR | Status: DC | PRN
  Administered 2012-04-06: .6 mg via INTRAVENOUS

## 2012-04-06 SURGICAL SUPPLY — 32 items
BENZOIN TINCTURE PRP APPL 2/3 (GAUZE/BANDAGES/DRESSINGS) IMPLANT
BLADE SURG SZ10 CARB STEEL (BLADE) IMPLANT
CHLORAPREP W/TINT 26ML (MISCELLANEOUS) ×2 IMPLANT
CLOTH BEACON ORANGE TIMEOUT ST (SAFETY) ×2 IMPLANT
DECANTER SPIKE VIAL GLASS SM (MISCELLANEOUS) IMPLANT
DERMABOND ADVANCED (GAUZE/BANDAGES/DRESSINGS) ×1
DERMABOND ADVANCED .7 DNX12 (GAUZE/BANDAGES/DRESSINGS) ×1 IMPLANT
DRAIN PENROSE 18X1/2 LTX STRL (DRAIN) IMPLANT
DRAPE LAPAROTOMY TRNSV 102X78 (DRAPE) ×2 IMPLANT
DRAPE UTILITY XL STRL (DRAPES) ×2 IMPLANT
DRSG TEGADERM 4X4.75 (GAUZE/BANDAGES/DRESSINGS) ×2 IMPLANT
ELECT CAUTERY BLADE 6.4 (BLADE) ×2 IMPLANT
ELECT REM PT RETURN 9FT ADLT (ELECTROSURGICAL) ×2
ELECTRODE REM PT RTRN 9FT ADLT (ELECTROSURGICAL) ×1 IMPLANT
GLOVE BIO SURGEON STRL SZ7.5 (GLOVE) ×2 IMPLANT
GLOVE SURG SS PI 6.5 STRL IVOR (GLOVE) ×2 IMPLANT
GLOVE SURG SS PI 7.5 STRL IVOR (GLOVE) ×4 IMPLANT
GOWN STRL NON-REIN LRG LVL3 (GOWN DISPOSABLE) IMPLANT
GOWN STRL REIN XL XLG (GOWN DISPOSABLE) ×4 IMPLANT
KIT BASIN OR (CUSTOM PROCEDURE TRAY) ×2 IMPLANT
MESH ULTRAPRO 3X6 7.6X15CM (Mesh General) ×2 IMPLANT
NEEDLE HYPO 22GX1.5 SAFETY (NEEDLE) ×2 IMPLANT
PACK GENERAL/GYN (CUSTOM PROCEDURE TRAY) ×2 IMPLANT
STRIP CLOSURE SKIN 1/2X4 (GAUZE/BANDAGES/DRESSINGS) ×2 IMPLANT
SUT MNCRL AB 4-0 PS2 18 (SUTURE) ×4 IMPLANT
SUT PROLENE 2 0 BLUE (SUTURE) ×4 IMPLANT
SUT VIC AB 2-0 SH 27 (SUTURE) ×2
SUT VIC AB 2-0 SH 27X BRD (SUTURE) ×2 IMPLANT
SUT VIC AB 3-0 SH 27 (SUTURE) ×2
SUT VIC AB 3-0 SH 27XBRD (SUTURE) ×2 IMPLANT
SYR CONTROL 10ML LL (SYRINGE) ×2 IMPLANT
TOWEL OR 17X26 10 PK STRL BLUE (TOWEL DISPOSABLE) ×2 IMPLANT

## 2012-04-06 NOTE — Anesthesia Preprocedure Evaluation (Addendum)
Anesthesia Evaluation  Patient identified by MRN, date of birth, ID band Patient awake  General Assessment Comment:Pt very sleepy pre-op  Reviewed: Allergy & Precautions, H&P , NPO status , Patient's Chart, lab work & pertinent test results  Airway Mallampati: I TM Distance: >3 FB Neck ROM: Full    Dental No notable dental hx.    Pulmonary neg pulmonary ROS,  breath sounds clear to auscultation  Pulmonary exam normal       Cardiovascular hypertension, Rhythm:Regular Rate:Normal     Neuro/Psych bil LE neuropathy  CVA negative psych ROS   GI/Hepatic Neg liver ROS, hiatal hernia,   Endo/Other  diabetes, Oral Hypoglycemic Agents  Renal/GU negative Renal ROS  negative genitourinary   Musculoskeletal negative musculoskeletal ROS (+)   Abdominal   Peds negative pediatric ROS (+)  Hematology negative hematology ROS (+)   Anesthesia Other Findings   Reproductive/Obstetrics negative OB ROS                           Anesthesia Physical Anesthesia Plan  ASA: III  Anesthesia Plan: General   Post-op Pain Management:    Induction: Intravenous  Airway Management Planned: Oral ETT  Additional Equipment:   Intra-op Plan:   Post-operative Plan: Extubation in OR  Informed Consent: I have reviewed the patients History and Physical, chart, labs and discussed the procedure including the risks, benefits and alternatives for the proposed anesthesia with the patient or authorized representative who has indicated his/her understanding and acceptance.   Dental advisory given  Plan Discussed with: CRNA  Anesthesia Plan Comments:         Anesthesia Quick Evaluation

## 2012-04-06 NOTE — Transfer of Care (Signed)
Immediate Anesthesia Transfer of Care Note  Patient: Matthew Parsons  Procedure(s) Performed: Procedure(s) (LRB) with comments: HERNIA REPAIR INGUINAL ADULT (Left) INSERTION OF MESH (Left)  Patient Location: PACU  Anesthesia Type:General  Level of Consciousness: awake, alert , oriented and patient cooperative  Airway & Oxygen Therapy: Patient Spontanous Breathing and Patient connected to face mask oxygen  Post-op Assessment: Report given to PACU RN, Post -op Vital signs reviewed and stable and Patient moving all extremities  Post vital signs: Reviewed and stable  Complications: No apparent anesthesia complications

## 2012-04-06 NOTE — Anesthesia Postprocedure Evaluation (Signed)
  Anesthesia Post-op Note  Patient: Matthew Parsons  Procedure(s) Performed: Procedure(s) (LRB): HERNIA REPAIR INGUINAL ADULT (Left) INSERTION OF MESH (Left)  Patient Location: PACU  Anesthesia Type: General  Level of Consciousness: awake and alert   Airway and Oxygen Therapy: Patient Spontanous Breathing  Post-op Pain: mild  Post-op Assessment: Post-op Vital signs reviewed, Patient's Cardiovascular Status Stable, Respiratory Function Stable, Patent Airway and No signs of Nausea or vomiting  Post-op Vital Signs: stable  Complications: No apparent anesthesia complications

## 2012-04-06 NOTE — Progress Notes (Signed)
Spoke with Dr. Biagio Quint regarding pt not wanting to take any narcotics for pain and can he use his tramadol and fentanyl. Dr. Biagio Quint ok'd for pt to resume tramadol 50mg  q 6hrs with his fentanyl patch if he chooses not use his narcotic prescription for pain.   Instructions given to pt and pt's wife with verbal understanding.

## 2012-04-06 NOTE — Preoperative (Signed)
Beta Blockers   Reason not to administer Beta Blockers:Not Applicable 

## 2012-04-06 NOTE — Brief Op Note (Signed)
04/06/2012  10:44 AM  PATIENT:  Matthew Parsons  78 y.o. male  PRE-OPERATIVE DIAGNOSIS:  left inguinal hernia  POST-OPERATIVE DIAGNOSIS:  left inguinal hernia  PROCEDURE:  Procedure(s) (LRB) with comments: HERNIA REPAIR INGUINAL ADULT (Left) INSERTION OF MESH (Left)  SURGEON:  Surgeon(s) and Role:    * Lodema Pilot, DO - Primary  PHYSICIAN ASSISTANT:   ASSISTANTS: none   ANESTHESIA:   general  EBL:  Total I/O In: 1100 [I.V.:1100] Out: 10 [Blood:10]  BLOOD ADMINISTERED:none  DRAINS: none   LOCAL MEDICATIONS USED:  MARCAINE    and LIDOCAINE   SPECIMEN:  No Specimen  DISPOSITION OF SPECIMEN:  N/A  COUNTS:  YES  TOURNIQUET:  * No tourniquets in log *  DICTATION: .Other Dictation: Dictation Number [id number]  PLAN OF CARE: Discharge to home after PACU  PATIENT DISPOSITION:  PACU - hemodynamically stable.   Delay start of Pharmacological VTE agent (>24hrs) due to surgical blood loss or risk of bleeding: no

## 2012-04-06 NOTE — H&P (View-Only) (Signed)
Patient ID: Matthew Parsons, male   DOB: [birth year], 78 y.o.   MRN: [id number]  Chief Complaint  Patient presents with  . Pre-op Exam    eval LIH    HPI Matthew Parsons is a 78 y.o. male.   HPI  This patient is referred by Dr. Wylene Simmer for evaluation of a reducible left inguinal hernia. He says that he has a history of a prior right inguinal hernia repair in 2008 and has not had any issues on that side since then. He noticed a bulge in the left groin about one to 2 years ago and he says it does not cause him any symptoms or discomfort but it is increasing in size and when he brought this up to his primary physician, he recommended repair. He denies any obstructive symptoms and denies any nausea or vomiting. He does take stool softener for his bowels. He denies any heart conditions but he is diabetic.  History reviewed. No pertinent past medical history.  History reviewed. No pertinent past surgical history.  History reviewed. No pertinent family history.  Social History History  Substance Use Topics  . Smoking status: Never Smoker   . Smokeless tobacco: Not on file  . Alcohol Use: No    Allergies  Allergen Reactions  . Hydrocodone     "makes me crazy"    Current Outpatient Prescriptions  Medication Sig Dispense Refill  . alfuzosin (UROXATRAL) 10 MG 24 hr tablet Take 10 mg by mouth daily.      Marland Kitchen aspirin 81 MG tablet Take 81 mg by mouth daily.      Marland Kitchen dutasteride (AVODART) 0.5 MG capsule Take 0.5 mg by mouth daily.      . fentaNYL (DURAGESIC - DOSED MCG/HR) 25 MCG/HR Place 1 patch onto the skin every 3 (three) days.      Marland Kitchen levothyroxine (SYNTHROID, LEVOTHROID) 112 MCG tablet Take 112 mcg by mouth daily.      . rosuvastatin (CRESTOR) 5 MG tablet Take 5 mg by mouth daily.      . sitaGLIPtan-metformin (JANUMET) 50-1000 MG per tablet Take 1 tablet by mouth 2 (two) times daily with a meal.      . temazepam (RESTORIL) 30 MG capsule Take 30 mg by mouth at bedtime as needed.      . traMADol  (ULTRAM) 50 MG tablet Take 50 mg by mouth 3 (three) times daily.        Review of Systems Review of Systems All other review of systems negative or noncontributory except as stated in the HPI See clinic intake form Blood pressure 124/66, pulse 92, temperature 97.3 F (36.3 C), temperature source Temporal, resp. rate 16, height 5\' 8"  (1.727 m), weight 163 lb 9.6 oz (74.208 kg).  Physical Exam Physical Exam Physical Exam  Vitals reviewed. Constitutional: He is oriented to person, place, and time. He appears well-developed and well-nourished. No distress.  HENT:  Head: Normocephalic and atraumatic.  Mouth/Throat: No oropharyngeal exudate.  Eyes: Conjunctivae and EOM are normal. Pupils are equal, round, and reactive to light. Right eye exhibits no discharge. Left eye exhibits no discharge. No scleral icterus.  Neck: Normal range of motion. No tracheal deviation present.  Cardiovascular: Normal rate, regular rhythm and normal heart sounds.   Pulmonary/Chest: Effort normal and breath sounds normal. No stridor. No respiratory distress. He has no wheezes. He has no rales. He exhibits no tenderness.  Abdominal: Soft. Bowel sounds are normal. He exhibits no distension and no mass. There is no  tenderness. There is no rebound and no guarding. he is a well-healed surgical scar on the right. There is no evidence of recurrent hernia on the right. He does have a large inguinal bulge on the left which is reducible and nontender. This is consistent with a reducible left inguinal hernia. Musculoskeletal: Normal range of motion. He exhibits no edema and no tenderness.  Neurological: He is alert and oriented to person, place, and time.  Skin: Skin is warm and dry. No rash noted. He is not diaphoretic. No erythema. No pallor.  Psychiatric: He has a normal mood and affect. His behavior is normal. Judgment and thought content normal.    Data Reviewed  Assessment    Reducible left internal hernia He does  have an asymptomatic but increasing left inguinal hernia and he would like to have this repaired. We discussed the options of a watchful waiting versus surgery both open and laparoscopic.he would like to proceed with inguinal hernia repair.  I think that open repair would probably be the best for this patient given the fact that he is a fairly large hernia and this would give [place] more anesthetic options as well. We discussed the procedure and the risks and benefits and he would like to proceed with hernia surgery. We discussed the risks of infection, bleeding, pain, scarring, recurrence, nerve injury and injury to the vas deferens or testicles, as well as the risks of anesthesia and he expressed understanding and would like to proceed.   Plan    We will proceed with open left inguinal hernia repair when available.       Lodema Pilot DAVID 03/24/2012, 12:51 PM

## 2012-04-06 NOTE — Interval H&P Note (Signed)
History and Physical Interval Note:  04/06/2012 8:41 AM  Matthew Parsons  has presented today for surgery, with the diagnosis of left inguinal hernia  The various methods of treatment have been discussed with the patient and family. After consideration of risks, benefits and other options for treatment, the patient has consented to  Procedure(s) (LRB) with comments: HERNIA REPAIR INGUINAL ADULT (Left) INSERTION OF MESH (Left) as a surgical intervention .  The patient's history has been reviewed, patient examined, no change in status, stable for surgery.  I have reviewed the patient's chart and labs.  Questions were answered to the patient's satisfaction.  I have seen and examined him in the preop holding area.  Left inguinal hernia present and marked.  Risks of infection, bleeding, pain, scarring, recurrence, and nerve injury, chronic pain, and injury to vas deferens or testicle discussed in lay terms and he expressed understanding and desires to proceed with open LIH with mesh.   Lodema Pilot DAVID

## 2012-04-07 ENCOUNTER — Encounter (HOSPITAL_COMMUNITY): Payer: Self-pay | Admitting: General Surgery

## 2012-04-07 NOTE — Op Note (Signed)
NAMENAJEEB, UPTAIN                 ACCOUNT NO.:  [id number]  MEDICAL RECORD NO.:  [id number]  LOCATION:  [facility]                         FACILITY:  [facility]  PHYSICIAN:  Lodema Pilot, MD       DATE OF BIRTH:  25-Apr-1927  DATE OF PROCEDURE:  04/06/2012 DATE OF DISCHARGE:  04/06/2012                              OPERATIVE REPORT   PROCEDURE:  Open repair of left inguinal hernia with mesh.  PREOPERATIVE DIAGNOSIS:  Left inguinal hernia.  POSTOPERATIVE DIAGNOSIS:  Left inguinal hernia.  SURGEON:  Lodema Pilot, MD  ASSISTANT:  None.  ANESTHESIA:  General endotracheal tube anesthesia with 40 mL of 1% lidocaine with epinephrine and 0.25% Marcaine in a 50:50 mixture.  FLUIDS:  One liter of crystalloid.  ESTIMATED BLOOD LOSS:  Minimal.  DRAINS:  None.  SPECIMENS:  None.  COMPLICATIONS:  None apparent.  FINDINGS:  Large direct and indirect inguinal hernia containing bowel, imbrication of the floor with 2-0 Vicryl suture and inguinal hernia repair with 3-inch x 6-inch UltraPro mesh.  INDICATION FOR PROCEDURE:  Mr. Johnsey is an 78 year old male with an enlarging left inguinal hernia, who desires repair.  OPERATIVE DETAILS:  Mr. Dolinger was seen and evaluated in the preoperative area, and risks and benefits of the procedure were discussed in lay terms.  Informed consent was obtained.  Surgical site was marked prior to anesthetic administration.  He was given prophylactic antibiotics and taken to the operating room, placed on the table in supine position and general endotracheal tube anesthesia was obtained.  His scrotum and groin were prepped and draped in a standard surgical fashion, and procedure time-out was performed with all operative team members to confirm proper patient, procedure.  Incision was made over the left groin and dissection carried down through the subcutaneous tissues using Bovie electrocautery.  External oblique fascia was identified and sharply incised along the  length of the fibers to the external ring. Immediately underlying the external oblique muscle, we had protrusion of a large hernia and I dissected around the spermatic cord and the hernia sac at the pubic tubercle, and the Penrose drain was passed for traction.  He had basically no inguinal floor and a large direct inguinal hernia.  I separated this from the spermatic cord and it turned out that.  He basically had direct and indirect inguinal hernia, which was contiguous.  I could see some intestine within the hernia.  I minimized cautery around the area of intestine and most of the sac was separated from the spermatic cord structures using blunt dissection. After I separated the hernia sac from the spermatic cord, I replaced this back into the abdomen and imbricated the inguinal canal over the hernia defect with 2-0 Vicryl suture to prevent protrusion of the direct hernia.  He still had some protrusion around the internal ring and another suture was placed around the internal ring to tighten up the muscle around the ring to prevent protrusion of these hernia contents. Care was taken to place the sutures too deep and catch underlying intestine.  This allowed me to further investigate the spermatic cord. There was no other hernia sac identified and irrigated the canal and  hemostasis was adequate.  Then, a 3-inch x 6-inch UltraPro mesh was tailored to fit the inguinal canal and sutured in place at the pubic tubercle and a 2-0 Prolene suture was run along the shelving edge of the inguinal ligament, suturing the mesh and the caudad margin.  Slit was placed in the lateral aspect of the mesh and the tails of the mesh were passed around this spermatic cord.  Interrupted 2-0 Prolene sutures were placed circumferentially around the mesh to suture this to the abdominal Darrow, taking care not to place the sutures too deep, again to capture any underlying contents.  The tails of the mesh were tacked  around the spermatic cord creating a new internal ring and the inguinal canal was again irrigated and inspected for hemostasis.  The external oblique fascia was approximated with a 2-0 Vicryl suture creating a new internal ring and Scarpa's fascia was approximated with 3-0 Vicryl suture.  The wound was injected with 40 mL of 1% lidocaine with epinephrine and 0.25% Marcaine in a 50:50 mixture and the skin edges were approximated with 4- 0 Monocryl subcuticular suture.  Skin was washed and dried and Dermabond was applied.  All sponge, needle, and instrument counts were correct at the end of the case.  The patient tolerated the procedure well without apparent complication.          ______________________________ Lodema Pilot, MD     BL/MEDQ  D:  04/06/2012  T:  04/07/2012  Job:  [id number]

## 2012-04-21 ENCOUNTER — Ambulatory Visit (INDEPENDENT_AMBULATORY_CARE_PROVIDER_SITE_OTHER): Payer: Medicare Other | Admitting: General Surgery

## 2012-04-21 ENCOUNTER — Encounter (INDEPENDENT_AMBULATORY_CARE_PROVIDER_SITE_OTHER): Payer: Self-pay | Admitting: General Surgery

## 2012-04-21 VITALS — BP 124/68 | HR 77 | Temp 96.9°F | Resp 16 | Ht 69.0 in | Wt 156.6 lb

## 2012-04-21 DIAGNOSIS — K4090 Unilateral inguinal hernia, without obstruction or gangrene, not specified as recurrent: Secondary | ICD-10-CM

## 2012-04-21 NOTE — Patient Instructions (Signed)
We will schedule you to see Dr Biagio Quint again in 2 weeks.

## 2012-04-22 NOTE — Progress Notes (Signed)
Matthew Parsons is a 77 y.o. male who is here for a follow up visit regarding his ing hernia repair.  He is having some swelling.  This improves with ice and rest.  Otherwise doing well  Objective: Filed Vitals:   04/21/12 1418  BP: 124/68  Pulse: 77  Temp: 96.9 F (36.1 C)  Resp: 16    General appearance: cooperative, NAD Inc: healing well, no drainage or erythema Scrotal swelling, no sign of recurrent hernia   Assessment and Plan: Return to clinic in 2 weeks.  Cont ice prn    .Vanita Panda, MD [facility], [place] [phone]

## 2012-05-04 ENCOUNTER — Encounter (INDEPENDENT_AMBULATORY_CARE_PROVIDER_SITE_OTHER): Payer: Self-pay | Admitting: General Surgery

## 2012-05-04 ENCOUNTER — Ambulatory Visit (INDEPENDENT_AMBULATORY_CARE_PROVIDER_SITE_OTHER): Payer: Medicare Other | Admitting: General Surgery

## 2012-05-04 VITALS — BP 126/82 | HR 82 | Temp 98.4°F | Resp 18 | Ht 68.0 in | Wt 156.8 lb

## 2012-05-04 DIAGNOSIS — Z5189 Encounter for other specified aftercare: Secondary | ICD-10-CM

## 2012-05-04 DIAGNOSIS — Z4889 Encounter for other specified surgical aftercare: Secondary | ICD-10-CM

## 2012-05-04 NOTE — Progress Notes (Signed)
Subjective:     Patient ID: Matthew Parsons, male   DOB: 1927-02-13, 78 y.o.   MRN: [id number]  HPI This patient follows up almost 4 weeks status post open repair of left inguinal hernia. He did not have any discomfort in the area of the does have some hardness in the area of incision in his groin.  Review of Systems     Objective:   Physical Exam No acute distress and nontoxic-appearing His incision is healing well without sign of infection. He does have some hardness in the area of incision and just distal to his incision which I think is normal healing ridge. I do not see any evidence of recurrent hernia and no bulge with Valsalva.    Assessment:     Status postopen repair left inguinal hernia-doing well He seems to be healing appropriately. He does have a normal healing ridge but I do not see any evidence of recurrent hernia. He can gradually increase his activity as tolerated. If this healing ridge does not soften up over the next 2-3 months but he will come back and we can reevaluate him at that time but again, I do not think that he has any evidence of recurrent hernia.    Plan:     followup in 3 months PRN

## 2012-06-24 ENCOUNTER — Encounter (INDEPENDENT_AMBULATORY_CARE_PROVIDER_SITE_OTHER): Payer: Self-pay | Admitting: General Surgery

## 2012-06-29 DIAGNOSIS — G619 Inflammatory polyneuropathy, unspecified: Secondary | ICD-10-CM | POA: Diagnosis not present

## 2012-07-21 ENCOUNTER — Encounter (INDEPENDENT_AMBULATORY_CARE_PROVIDER_SITE_OTHER): Payer: Medicare Other | Admitting: General Surgery

## 2012-08-03 ENCOUNTER — Encounter (INDEPENDENT_AMBULATORY_CARE_PROVIDER_SITE_OTHER): Payer: Self-pay | Admitting: General Surgery

## 2012-08-03 ENCOUNTER — Ambulatory Visit (INDEPENDENT_AMBULATORY_CARE_PROVIDER_SITE_OTHER): Payer: Medicare Other | Admitting: General Surgery

## 2012-08-03 VITALS — BP 116/68 | HR 71 | Temp 97.9°F | Resp 16 | Ht 68.0 in | Wt 160.8 lb

## 2012-08-03 DIAGNOSIS — Z8719 Personal history of other diseases of the digestive system: Secondary | ICD-10-CM

## 2012-08-03 DIAGNOSIS — Z9889 Other specified postprocedural states: Secondary | ICD-10-CM | POA: Diagnosis not present

## 2012-08-03 NOTE — Progress Notes (Signed)
Subjective:     Patient ID: Matthew Parsons, male   DOB: 06-26-1926, 79 y.o.   MRN: [id number]  HPI This patient follows up status post open repair of left inguinal hernia back in October. He comes in today because "I thought you might want to see your handwork". He has no complaints and denies any bulge in that area. He has return to his regular activities. His bowels are functioning normally and tolerating regular diet.  Review of Systems     Objective:   Physical Exam His incision is healing well without any sign of infection or evidence of recurrent hernia or pain.    Assessment:     Status post open repair of left inguinal hernia-doing well He is recovering nicely from his procedure and has no evidence of recurrence. At this point he can proceed with activity as tolerated. no limitations for him.     Plan:     He can followup with me on a when necessary basis

## 2012-08-31 DIAGNOSIS — E785 Hyperlipidemia, unspecified: Secondary | ICD-10-CM | POA: Diagnosis not present

## 2012-08-31 DIAGNOSIS — E11319 Type 2 diabetes mellitus with unspecified diabetic retinopathy without macular edema: Secondary | ICD-10-CM | POA: Diagnosis not present

## 2012-08-31 DIAGNOSIS — D509 Iron deficiency anemia, unspecified: Secondary | ICD-10-CM | POA: Diagnosis not present

## 2012-08-31 DIAGNOSIS — E039 Hypothyroidism, unspecified: Secondary | ICD-10-CM | POA: Diagnosis not present

## 2012-08-31 DIAGNOSIS — Z1331 Encounter for screening for depression: Secondary | ICD-10-CM | POA: Diagnosis not present

## 2012-08-31 DIAGNOSIS — Z79899 Other long term (current) drug therapy: Secondary | ICD-10-CM | POA: Diagnosis not present

## 2012-08-31 DIAGNOSIS — I10 Essential (primary) hypertension: Secondary | ICD-10-CM | POA: Diagnosis not present

## 2012-08-31 DIAGNOSIS — E1039 Type 1 diabetes mellitus with other diabetic ophthalmic complication: Secondary | ICD-10-CM | POA: Diagnosis not present

## 2012-08-31 DIAGNOSIS — G4700 Insomnia, unspecified: Secondary | ICD-10-CM | POA: Diagnosis not present

## 2012-08-31 DIAGNOSIS — E1065 Type 1 diabetes mellitus with hyperglycemia: Secondary | ICD-10-CM | POA: Diagnosis not present

## 2013-01-12 DIAGNOSIS — E119 Type 2 diabetes mellitus without complications: Secondary | ICD-10-CM | POA: Diagnosis not present

## 2013-03-02 DIAGNOSIS — E039 Hypothyroidism, unspecified: Secondary | ICD-10-CM | POA: Diagnosis not present

## 2013-03-02 DIAGNOSIS — Z125 Encounter for screening for malignant neoplasm of prostate: Secondary | ICD-10-CM | POA: Diagnosis not present

## 2013-03-02 DIAGNOSIS — E785 Hyperlipidemia, unspecified: Secondary | ICD-10-CM | POA: Diagnosis not present

## 2013-03-02 DIAGNOSIS — E1039 Type 1 diabetes mellitus with other diabetic ophthalmic complication: Secondary | ICD-10-CM | POA: Diagnosis not present

## 2013-03-09 DIAGNOSIS — F411 Generalized anxiety disorder: Secondary | ICD-10-CM | POA: Diagnosis not present

## 2013-03-09 DIAGNOSIS — M79609 Pain in unspecified limb: Secondary | ICD-10-CM | POA: Diagnosis not present

## 2013-03-09 DIAGNOSIS — E11319 Type 2 diabetes mellitus with unspecified diabetic retinopathy without macular edema: Secondary | ICD-10-CM | POA: Diagnosis not present

## 2013-03-09 DIAGNOSIS — Z0000 Encounter for general adult medical examination without abnormal findings: Secondary | ICD-10-CM | POA: Diagnosis not present

## 2013-03-09 DIAGNOSIS — E1149 Type 2 diabetes mellitus with other diabetic neurological complication: Secondary | ICD-10-CM | POA: Diagnosis not present

## 2013-03-09 DIAGNOSIS — Z23 Encounter for immunization: Secondary | ICD-10-CM | POA: Diagnosis not present

## 2013-03-09 DIAGNOSIS — E039 Hypothyroidism, unspecified: Secondary | ICD-10-CM | POA: Diagnosis not present

## 2013-03-09 DIAGNOSIS — I10 Essential (primary) hypertension: Secondary | ICD-10-CM | POA: Diagnosis not present

## 2013-03-09 DIAGNOSIS — E785 Hyperlipidemia, unspecified: Secondary | ICD-10-CM | POA: Diagnosis not present

## 2013-03-10 DIAGNOSIS — Z1212 Encounter for screening for malignant neoplasm of rectum: Secondary | ICD-10-CM | POA: Diagnosis not present

## 2013-04-25 DIAGNOSIS — E1142 Type 2 diabetes mellitus with diabetic polyneuropathy: Secondary | ICD-10-CM | POA: Diagnosis not present

## 2013-04-25 DIAGNOSIS — E1149 Type 2 diabetes mellitus with other diabetic neurological complication: Secondary | ICD-10-CM | POA: Diagnosis not present

## 2013-05-17 DIAGNOSIS — E1149 Type 2 diabetes mellitus with other diabetic neurological complication: Secondary | ICD-10-CM | POA: Diagnosis not present

## 2013-06-14 DIAGNOSIS — Z79899 Other long term (current) drug therapy: Secondary | ICD-10-CM | POA: Diagnosis not present

## 2013-06-14 DIAGNOSIS — G4700 Insomnia, unspecified: Secondary | ICD-10-CM | POA: Diagnosis not present

## 2013-06-14 DIAGNOSIS — D509 Iron deficiency anemia, unspecified: Secondary | ICD-10-CM | POA: Diagnosis not present

## 2013-06-14 DIAGNOSIS — F411 Generalized anxiety disorder: Secondary | ICD-10-CM | POA: Diagnosis not present

## 2013-06-14 DIAGNOSIS — N138 Other obstructive and reflux uropathy: Secondary | ICD-10-CM | POA: Diagnosis not present

## 2013-06-14 DIAGNOSIS — E1149 Type 2 diabetes mellitus with other diabetic neurological complication: Secondary | ICD-10-CM | POA: Diagnosis not present

## 2013-06-14 DIAGNOSIS — N401 Enlarged prostate with lower urinary tract symptoms: Secondary | ICD-10-CM | POA: Diagnosis not present

## 2013-06-14 DIAGNOSIS — M79609 Pain in unspecified limb: Secondary | ICD-10-CM | POA: Diagnosis not present

## 2013-06-14 DIAGNOSIS — E1142 Type 2 diabetes mellitus with diabetic polyneuropathy: Secondary | ICD-10-CM | POA: Diagnosis not present

## 2013-06-14 DIAGNOSIS — E039 Hypothyroidism, unspecified: Secondary | ICD-10-CM | POA: Diagnosis not present

## 2013-07-13 DIAGNOSIS — N401 Enlarged prostate with lower urinary tract symptoms: Secondary | ICD-10-CM | POA: Diagnosis not present

## 2013-07-13 DIAGNOSIS — N139 Obstructive and reflux uropathy, unspecified: Secondary | ICD-10-CM | POA: Diagnosis not present

## 2013-07-26 ENCOUNTER — Other Ambulatory Visit: Payer: Self-pay | Admitting: Anesthesiology

## 2013-07-28 ENCOUNTER — Inpatient Hospital Stay (HOSPITAL_COMMUNITY)
Admission: RE | Admit: 2013-07-28 | Discharge: 2013-07-28 | Disposition: A | Discharge status: Home / Self Care | Payer: Medicare Other | Source: Ambulatory Visit

## 2013-07-28 NOTE — Pre-Procedure Instructions (Signed)
Matthew Parsons  07/28/2013   Your procedure is scheduled on:  Feb. 27 at 1230  Report to [place]  2 * 3 at 1030 AM.  Call this number if you have problems the morning of surgery: [phone]   Remember:   Do not eat food or drink liquids after midnight.   Take these medicines the morning of surgery with A SIP OF WATER: alfluzosin (Uroxatral), Levothyroxine (Synthroid), Oxycodone (roxicet) if needed for pain  Stop taking herbal medications, Fish Oil, BC's, Goody's, Ibuprofen, Aleve, Aspirin, or any medications containing Asprin.   Do not wear jewelry, make-up or nail polish.  Do not wear lotions, powders, or perfumes. You may wear deodorant.  Do not shave 48 hours prior to surgery. Men may shave face and neck.  Do not bring valuables to the hospital.  [facility] is not responsible                  for any belongings or valuables.               Contacts, dentures or bridgework may not be worn into surgery.  Leave suitcase in the car. After surgery it may be brought to your room.  For patients admitted to the hospital, discharge time is determined by your                treatment team.               Patients discharged the day of surgery will not be allowed to drive  home.    Special Instructions: Shower using CHG 2 nights before surgery and the night before surgery.  If you shower the day of surgery use CHG.  Use special wash - you have one bottle of CHG for all showers.  You should use approximately 1/3 of the bottle for each shower.   Please read over the following fact sheets that you were given: Pain Booklet, Coughing and Deep Breathing, MRSA Information and Surgical Site Infection Prevention

## 2013-08-01 NOTE — Pre-Procedure Instructions (Signed)
Matthew Parsons  08/01/2013   Your procedure is scheduled on:  Friday Feb. 27, 2015 at 12:30 PM.  Report to [facility] Entrance "A"  Admitting at 10:30 AM.  Call this number if you have problems the morning of surgery: [phone]   Remember:   Do not eat food or drink liquids after midnight.   Take these medicines the morning of surgery with A SIP OF WATER: Finasteride (Proscar), Gabapentin (Neurontin),  Levothyroxine (Synthroid)    Do NOT take any diabetic medications the morning of your surgery  Stop taking herbal medications, Fish Oil, BC's, Goody's, Ibuprofen, Aleve, Aspirin, or any medications containing Asprin.   Do not wear jewelry.  Do not wear lotions, powders, or colognes. You may wear deodorant.  Men may shave face and neck.  Do not bring valuables to the hospital.  [facility] is not responsible for any belongings or valuables.               Contacts, dentures or bridgework may not be worn into surgery.  Leave suitcase in the car. After surgery it may be brought to your room.  For patients admitted to the hospital, discharge time is determined by your treatment team.               Patients discharged the day of surgery will not be allowed to drive home.    Special Instructions: Shower using CHG 2 nights before surgery and the night before surgery.  If you shower the day of surgery use CHG.  Use special wash - you have one bottle of CHG for all showers.  You should use approximately 1/3 of the bottle for each shower.   Please read over the following fact sheets that you were given: Pain Booklet, Coughing and Deep Breathing, MRSA Information and Surgical Site Infection Prevention

## 2013-08-02 ENCOUNTER — Encounter (HOSPITAL_COMMUNITY): Payer: Self-pay | Admitting: Vascular Surgery

## 2013-08-02 ENCOUNTER — Ambulatory Visit (HOSPITAL_COMMUNITY)
Admission: RE | Admit: 2013-08-02 | Discharge: 2013-08-02 | Disposition: A | Discharge status: Home / Self Care | Payer: Medicare Other | Source: Ambulatory Visit | Attending: Anesthesiology | Admitting: Anesthesiology

## 2013-08-02 ENCOUNTER — Encounter (HOSPITAL_COMMUNITY)
Admission: RE | Admit: 2013-08-02 | Discharge: 2013-08-02 | Disposition: A | Discharge status: Home / Self Care | Payer: Medicare Other | Source: Ambulatory Visit | Attending: Anesthesiology | Admitting: Anesthesiology

## 2013-08-02 ENCOUNTER — Encounter (HOSPITAL_COMMUNITY): Payer: Self-pay

## 2013-08-02 DIAGNOSIS — Z01818 Encounter for other preprocedural examination: Secondary | ICD-10-CM | POA: Insufficient documentation

## 2013-08-02 DIAGNOSIS — K449 Diaphragmatic hernia without obstruction or gangrene: Secondary | ICD-10-CM | POA: Diagnosis not present

## 2013-08-02 DIAGNOSIS — Z8673 Personal history of transient ischemic attack (TIA), and cerebral infarction without residual deficits: Secondary | ICD-10-CM | POA: Insufficient documentation

## 2013-08-02 DIAGNOSIS — E039 Hypothyroidism, unspecified: Secondary | ICD-10-CM | POA: Diagnosis not present

## 2013-08-02 DIAGNOSIS — E785 Hyperlipidemia, unspecified: Secondary | ICD-10-CM | POA: Diagnosis not present

## 2013-08-02 DIAGNOSIS — I10 Essential (primary) hypertension: Secondary | ICD-10-CM | POA: Insufficient documentation

## 2013-08-02 DIAGNOSIS — Z01810 Encounter for preprocedural cardiovascular examination: Secondary | ICD-10-CM | POA: Diagnosis not present

## 2013-08-02 DIAGNOSIS — Z01812 Encounter for preprocedural laboratory examination: Secondary | ICD-10-CM | POA: Insufficient documentation

## 2013-08-02 DIAGNOSIS — E119 Type 2 diabetes mellitus without complications: Secondary | ICD-10-CM | POA: Insufficient documentation

## 2013-08-02 HISTORY — DX: Diarrhea, unspecified: R19.7

## 2013-08-02 LAB — CBC
HEMATOCRIT: 39.2 % (ref 39.0–52.0)
HEMOGLOBIN: 12.1 g/dL — AB (ref 13.0–17.0)
MCH: 24.5 pg — AB (ref 26.0–34.0)
MCHC: 30.9 g/dL (ref 30.0–36.0)
MCV: 79.5 fL (ref 78.0–100.0)
Platelets: 166 10*3/uL (ref 150–400)
RBC: 4.93 MIL/uL (ref 4.22–5.81)
RDW: 14.6 % (ref 11.5–15.5)
WBC: 6.8 10*3/uL (ref 4.0–10.5)

## 2013-08-02 LAB — BASIC METABOLIC PANEL
BUN: 12 mg/dL (ref 6–23)
CHLORIDE: 99 meq/L (ref 96–112)
CO2: 26 mEq/L (ref 19–32)
CREATININE: 0.75 mg/dL (ref 0.50–1.35)
Calcium: 9.6 mg/dL (ref 8.4–10.5)
GFR calc Af Amer: >90 mL/min (ref >90)
GFR calc non Af Amer: 82 mL/min — ABNORMAL LOW (ref >90)
GLUCOSE: 211 mg/dL — AB (ref 70–99)
POTASSIUM: 4.5 meq/L (ref 3.7–5.3)
Sodium: 139 mEq/L (ref 137–147)

## 2013-08-02 LAB — SURGICAL PCR SCREEN
MRSA, PCR: NEGATIVE
Staphylococcus aureus: NEGATIVE

## 2013-08-02 NOTE — Progress Notes (Signed)
PCP is Richard IT trainer is Dr. Lowella Bandy. Patient denied having any cardiac or pulmonary issues.

## 2013-08-02 NOTE — Progress Notes (Signed)
Nurse called and left a voicemail with Janett Billow at Dr. Maryjean Ka office requesting that Dr. Maryjean Ka sign orders at his earliest convenience.

## 2013-08-03 ENCOUNTER — Encounter (HOSPITAL_COMMUNITY): Payer: Self-pay | Admitting: Anesthesiology

## 2013-08-03 MED ORDER — CEFAZOLIN SODIUM-DEXTROSE 2-3 GM-% IV SOLR
2.0000 g | INTRAVENOUS | Status: DC
Start: 2013-08-04 — End: 2013-08-04

## 2013-08-03 NOTE — Progress Notes (Signed)
Anesthesia Chart Review:  Patient is a 80 year old male scheduled for lumbar spinal cord stimulator insertion on 08/04/13 by Dr. Maryjean Ka.    History includes non-smoker, DM2, HTN, HLD, hypothyroidism, CVA ~ '07, anemia, pericarditis, hiatal hernia, skin cancer, urinary frequency, bilateral IHR '13, T&A, spinal surgery, cholecystectomy. PCP is listed as Dr. Domenick Gong.  EKG on 08/02/13 showed SR with sinus arrhythmia, first degree AVB, minimal voltage criteria for LVH, poor anterior r wave progression. R wave is slightly lower in V3 when compared to prior EKG on 03/25/12. He denied any cardiopulmonary issues at his PAT appointment.  CXR on 08/02/13 showed moderate to large hiatal hernia, no acute findings.  Preoperative labs noted.  He will get a CBG on arrival.  Further evaluation by his assigned anesthesiologist on the day of surgery, but if no acute changes or new CV symptoms then I would anticipate that he could proceed as planned.  George Hugh [facility]/Anesthesiology Phone [phone] 08/03/2013 9:50 AM

## 2013-08-04 ENCOUNTER — Encounter (HOSPITAL_COMMUNITY): Admission: RE | Payer: Self-pay | Source: Ambulatory Visit

## 2013-08-04 ENCOUNTER — Ambulatory Visit (HOSPITAL_COMMUNITY): Admission: RE | Admit: 2013-08-04 | Payer: Medicare Other | Source: Ambulatory Visit | Admitting: Anesthesiology

## 2013-08-04 SURGERY — INSERTION, SPINAL CORD STIMULATOR, LUMBAR
Anesthesia: Monitor Anesthesia Care | Site: Back

## 2013-08-18 ENCOUNTER — Encounter (HOSPITAL_COMMUNITY): Payer: Self-pay

## 2013-08-18 ENCOUNTER — Encounter (HOSPITAL_COMMUNITY)
Admission: RE | Admit: 2013-08-18 | Discharge: 2013-08-18 | Disposition: A | Discharge status: Home / Self Care | Payer: Medicare Other | Source: Ambulatory Visit | Attending: Anesthesiology | Admitting: Anesthesiology

## 2013-08-18 DIAGNOSIS — Z01812 Encounter for preprocedural laboratory examination: Secondary | ICD-10-CM | POA: Diagnosis not present

## 2013-08-18 LAB — CBC
HEMATOCRIT: 39.3 % (ref 39.0–52.0)
Hemoglobin: 12.2 g/dL — ABNORMAL LOW (ref 13.0–17.0)
MCH: 24.4 pg — ABNORMAL LOW (ref 26.0–34.0)
MCHC: 31 g/dL (ref 30.0–36.0)
MCV: 78.4 fL (ref 78.0–100.0)
PLATELETS: 154 10*3/uL (ref 150–400)
RBC: 5.01 MIL/uL (ref 4.22–5.81)
RDW: 14.4 % (ref 11.5–15.5)
WBC: 6.3 10*3/uL (ref 4.0–10.5)

## 2013-08-18 LAB — BASIC METABOLIC PANEL
BUN: 14 mg/dL (ref 6–23)
CALCIUM: 9.8 mg/dL (ref 8.4–10.5)
CHLORIDE: 101 meq/L (ref 96–112)
CO2: 27 meq/L (ref 19–32)
Creatinine, Ser: 0.8 mg/dL (ref 0.50–1.35)
GFR calc Af Amer: >90 mL/min (ref >90)
GFR calc non Af Amer: 80 mL/min — ABNORMAL LOW (ref >90)
Glucose, Bld: 233 mg/dL — ABNORMAL HIGH (ref 70–99)
Potassium: 4.8 mEq/L (ref 3.7–5.3)
Sodium: 140 mEq/L (ref 137–147)

## 2013-08-18 NOTE — Pre-Procedure Instructions (Signed)
Matthew Parsons  08/18/2013   Your procedure is scheduled on:  08-25-2013  Friday   Report to [place]  2 * 3 at 8:00 AM.  Call this number if you have problems the morning of surgery: [phone]   Remember:    Do not eat food or drink liquids after midnight.   Take these medicines the morning of surgery with A SIP OF WATER: proscar,crestor,levothyroxine,gabapentin              NO DIABETIC MEDICATIONS THE MORNING OF SURGERY   Do not wear jewelry.  Do not wear lotions, powders, or perfumes. .  Do not shave 48 hours prior to surgery. Men may shave face and neck.  Do not bring valuables to the hospital.  [facility] is not responsible for any belongings or valuables.               Contacts, dentures or bridgework may not be worn into surgery.   Leave suitcase in the car. After surgery it may be brought to your room.  For patients admitted to the hospital, discharge time is determined by your treatment team.               Patients discharged the day of surgery will not be allowed to drive home.   Name and phone number of your driver:      Please read over the following fact sheets that you were given: Pain Booklet and Surgical Site Infection Prevention

## 2013-08-25 ENCOUNTER — Encounter (HOSPITAL_COMMUNITY)
Admission: RE | Disposition: A | Discharge status: Home / Self Care | Payer: Self-pay | Source: Ambulatory Visit | Attending: Anesthesiology

## 2013-08-25 ENCOUNTER — Ambulatory Visit (HOSPITAL_COMMUNITY): Payer: Medicare Other | Admitting: Anesthesiology

## 2013-08-25 ENCOUNTER — Ambulatory Visit (HOSPITAL_COMMUNITY)
Admission: RE | Admit: 2013-08-25 | Discharge: 2013-08-25 | Disposition: A | Discharge status: Home / Self Care | Payer: Medicare Other | Source: Ambulatory Visit | Attending: Anesthesiology | Admitting: Anesthesiology

## 2013-08-25 ENCOUNTER — Encounter (HOSPITAL_COMMUNITY): Payer: Self-pay | Admitting: *Deleted

## 2013-08-25 ENCOUNTER — Encounter (HOSPITAL_COMMUNITY): Payer: Medicare Other | Admitting: Anesthesiology

## 2013-08-25 DIAGNOSIS — G8929 Other chronic pain: Secondary | ICD-10-CM | POA: Diagnosis not present

## 2013-08-25 DIAGNOSIS — E785 Hyperlipidemia, unspecified: Secondary | ICD-10-CM | POA: Diagnosis not present

## 2013-08-25 DIAGNOSIS — Z7982 Long term (current) use of aspirin: Secondary | ICD-10-CM | POA: Diagnosis not present

## 2013-08-25 DIAGNOSIS — E1142 Type 2 diabetes mellitus with diabetic polyneuropathy: Secondary | ICD-10-CM | POA: Diagnosis not present

## 2013-08-25 DIAGNOSIS — Z462 Encounter for fitting and adjustment of other devices related to nervous system and special senses: Secondary | ICD-10-CM | POA: Diagnosis not present

## 2013-08-25 DIAGNOSIS — I10 Essential (primary) hypertension: Secondary | ICD-10-CM | POA: Diagnosis not present

## 2013-08-25 DIAGNOSIS — E079 Disorder of thyroid, unspecified: Secondary | ICD-10-CM | POA: Insufficient documentation

## 2013-08-25 DIAGNOSIS — E1149 Type 2 diabetes mellitus with other diabetic neurological complication: Secondary | ICD-10-CM | POA: Diagnosis not present

## 2013-08-25 DIAGNOSIS — E119 Type 2 diabetes mellitus without complications: Secondary | ICD-10-CM | POA: Diagnosis not present

## 2013-08-25 DIAGNOSIS — N189 Chronic kidney disease, unspecified: Secondary | ICD-10-CM | POA: Diagnosis not present

## 2013-08-25 DIAGNOSIS — M5030 Other cervical disc degeneration, unspecified cervical region: Secondary | ICD-10-CM | POA: Insufficient documentation

## 2013-08-25 DIAGNOSIS — Z8673 Personal history of transient ischemic attack (TIA), and cerebral infarction without residual deficits: Secondary | ICD-10-CM | POA: Diagnosis not present

## 2013-08-25 DIAGNOSIS — M961 Postlaminectomy syndrome, not elsewhere classified: Secondary | ICD-10-CM | POA: Insufficient documentation

## 2013-08-25 DIAGNOSIS — D649 Anemia, unspecified: Secondary | ICD-10-CM | POA: Diagnosis not present

## 2013-08-25 HISTORY — PX: SPINAL CORD STIMULATOR INSERTION: SHX5378

## 2013-08-25 LAB — GLUCOSE, CAPILLARY
Glucose-Capillary: 157 mg/dL — ABNORMAL HIGH (ref 70–99)
Glucose-Capillary: 146 mg/dL — ABNORMAL HIGH (ref 70–99)

## 2013-08-25 SURGERY — INSERTION, SPINAL CORD STIMULATOR, LUMBAR
Anesthesia: Monitor Anesthesia Care

## 2013-08-25 MED ORDER — HYDROMORPHONE HCL PF 1 MG/ML IJ SOLN: 0.2500 mg | INTRAMUSCULAR | Status: DC | PRN

## 2013-08-25 MED ORDER — PROPOFOL INFUSION 10 MG/ML OPTIME
INTRAVENOUS | Status: DC | PRN
  Administered 2013-08-25: 25 ug/kg/min via INTRAVENOUS

## 2013-08-25 MED ORDER — MIDAZOLAM HCL 2 MG/2ML IJ SOLN
INTRAMUSCULAR | Status: AC
  Filled 2013-08-25: qty 2

## 2013-08-25 MED ORDER — OXYCODONE HCL 5 MG/5ML PO SOLN: 5.0000 mg | Freq: Once | ORAL | Status: DC | PRN

## 2013-08-25 MED ORDER — LIDOCAINE HCL (CARDIAC) 20 MG/ML IV SOLN
INTRAVENOUS | Status: AC
  Filled 2013-08-25: qty 5

## 2013-08-25 MED ORDER — LACTATED RINGERS IV SOLN
INTRAVENOUS | Status: DC | PRN
  Administered 2013-08-25: 10:00:00 via INTRAVENOUS

## 2013-08-25 MED ORDER — CEFAZOLIN SODIUM-DEXTROSE 2-3 GM-% IV SOLR
INTRAVENOUS | Status: AC
  Filled 2013-08-25: qty 50

## 2013-08-25 MED ORDER — BACITRACIN-NEOMYCIN-POLYMYXIN OINTMENT TUBE
TOPICAL_OINTMENT | CUTANEOUS | Status: DC | PRN
  Administered 2013-08-25: 1 via TOPICAL

## 2013-08-25 MED ORDER — OXYCODONE-ACETAMINOPHEN 5-325 MG PO TABS: 1.0000 | ORAL_TABLET | Freq: Four times a day (QID) | ORAL | Status: DC | PRN

## 2013-08-25 MED ORDER — OXYCODONE HCL 5 MG PO TABS: 5.0000 mg | ORAL_TABLET | Freq: Once | ORAL | Status: DC | PRN

## 2013-08-25 MED ORDER — PROPOFOL 10 MG/ML IV BOLUS
INTRAVENOUS | Status: AC
  Filled 2013-08-25: qty 20

## 2013-08-25 MED ORDER — LIDOCAINE HCL (CARDIAC) 20 MG/ML IV SOLN
INTRAVENOUS | Status: DC | PRN
  Administered 2013-08-25: 50 mg via INTRAVENOUS

## 2013-08-25 MED ORDER — PROMETHAZINE HCL 25 MG/ML IJ SOLN: 6.2500 mg | INTRAMUSCULAR | Status: DC | PRN

## 2013-08-25 MED ORDER — SODIUM CHLORIDE 0.9 % IR SOLN
Status: DC | PRN
  Administered 2013-08-25: 11:00:00

## 2013-08-25 MED ORDER — 0.9 % SODIUM CHLORIDE (POUR BTL) OPTIME
TOPICAL | Status: DC | PRN
  Administered 2013-08-25: 1000 mL

## 2013-08-25 MED ORDER — CEFAZOLIN SODIUM-DEXTROSE 2-3 GM-% IV SOLR
INTRAVENOUS | Status: DC | PRN
  Administered 2013-08-25: 2 g via INTRAVENOUS

## 2013-08-25 MED ORDER — CEPHALEXIN 500 MG PO CAPS: 500.0000 mg | ORAL_CAPSULE | Freq: Three times a day (TID) | ORAL | Status: DC

## 2013-08-25 MED ORDER — MEPERIDINE HCL 25 MG/ML IJ SOLN: 6.2500 mg | INTRAMUSCULAR | Status: DC | PRN

## 2013-08-25 MED ORDER — LACTATED RINGERS IV SOLN
INTRAVENOUS | Status: DC
  Administered 2013-08-25: 09:00:00 via INTRAVENOUS

## 2013-08-25 MED ORDER — MIDAZOLAM HCL 5 MG/5ML IJ SOLN
INTRAMUSCULAR | Status: DC | PRN
  Administered 2013-08-25: 2 mg via INTRAVENOUS

## 2013-08-25 MED ORDER — BUPIVACAINE-EPINEPHRINE (PF) 0.5% -1:200000 IJ SOLN
INTRAMUSCULAR | Status: DC | PRN
  Administered 2013-08-25: 7 mL

## 2013-08-25 SURGICAL SUPPLY — 60 items
BAG DECANTER FOR FLEXI CONT (MISCELLANEOUS) ×2 IMPLANT
BENZOIN TINCTURE PRP APPL 2/3 (GAUZE/BANDAGES/DRESSINGS) IMPLANT
BINDER ABDOMINAL 12 ML 46-62 (SOFTGOODS) ×2 IMPLANT
BLADE SURG ROTATE 9660 (MISCELLANEOUS) IMPLANT
CHLORAPREP W/TINT 26ML (MISCELLANEOUS) ×2 IMPLANT
CONT SPEC 4OZ CLIKSEAL STRL BL (MISCELLANEOUS) ×4 IMPLANT
DERMABOND ADVANCED (GAUZE/BANDAGES/DRESSINGS) ×1
DERMABOND ADVANCED .7 DNX12 (GAUZE/BANDAGES/DRESSINGS) ×1 IMPLANT
DRAPE C-ARM 42X72 X-RAY (DRAPES) IMPLANT
DRAPE C-ARMOR (DRAPES) IMPLANT
DRAPE CAMERA VIDEO/LASER (DRAPES) ×2 IMPLANT
DRAPE LAPAROTOMY 100X72X124 (DRAPES) ×2 IMPLANT
DRAPE POUCH INSTRU U-SHP 10X18 (DRAPES) ×2 IMPLANT
DRAPE SURG 17X23 STRL (DRAPES) IMPLANT
DRESSING TELFA 8X3 (GAUZE/BANDAGES/DRESSINGS) IMPLANT
DRSG OPSITE POSTOP 3X4 (GAUZE/BANDAGES/DRESSINGS) IMPLANT
DRSG OPSITE POSTOP 4X6 (GAUZE/BANDAGES/DRESSINGS) ×2 IMPLANT
ELECT REM PT RETURN 9FT ADLT (ELECTROSURGICAL) ×2
ELECTRODE REM PT RTRN 9FT ADLT (ELECTROSURGICAL) ×1 IMPLANT
GAUZE SPONGE 4X4 16PLY XRAY LF (GAUZE/BANDAGES/DRESSINGS) ×2 IMPLANT
GLOVE BIOGEL PI IND STRL 7.5 (GLOVE) ×2 IMPLANT
GLOVE BIOGEL PI INDICATOR 7.5 (GLOVE) ×2
GLOVE ECLIPSE 7.5 STRL STRAW (GLOVE) ×2 IMPLANT
GLOVE EXAM NITRILE LRG STRL (GLOVE) IMPLANT
GLOVE EXAM NITRILE MD LF STRL (GLOVE) IMPLANT
GLOVE EXAM NITRILE XL STR (GLOVE) IMPLANT
GLOVE EXAM NITRILE XS STR PU (GLOVE) IMPLANT
GLOVE SURG SS PI 7.0 STRL IVOR (GLOVE) ×2 IMPLANT
GOWN BRE IMP SLV AUR LG STRL (GOWN DISPOSABLE) IMPLANT
GOWN BRE IMP SLV AUR XL STRL (GOWN DISPOSABLE) IMPLANT
GOWN STRL REIN 2XL LVL4 (GOWN DISPOSABLE) IMPLANT
GOWN STRL REUS W/ TWL LRG LVL3 (GOWN DISPOSABLE) ×2 IMPLANT
GOWN STRL REUS W/TWL LRG LVL3 (GOWN DISPOSABLE) ×2
KIT BASIN OR (CUSTOM PROCEDURE TRAY) ×2 IMPLANT
KIT ROOM TURNOVER OR (KITS) ×2 IMPLANT
NEEDLE 18GX1X1/2 (RX/OR ONLY) (NEEDLE) IMPLANT
NEEDLE HYPO 25X1 1.5 SAFETY (NEEDLE) ×2 IMPLANT
NEURO GENER MRI  PRIMEADV (Neuro Prosthesis/Implant) ×1 IMPLANT
NEURO GENER MRI PRIMEADV (Neuro Prosthesis/Implant) ×1 IMPLANT
NS IRRIG 1000ML POUR BTL (IV SOLUTION) ×2 IMPLANT
PACK LAMINECTOMY NEURO (CUSTOM PROCEDURE TRAY) ×2 IMPLANT
PAD ARMBOARD 7.5X6 YLW CONV (MISCELLANEOUS) ×2 IMPLANT
PATIENT PROGRAMMER ×2 IMPLANT
PROGRAMMER PATIENT (MISCELLANEOUS) ×2 IMPLANT
PrimeAdvanced SureScan MRI (Neurostimulator) ×2 IMPLANT
SPONGE LAP 4X18 X RAY DECT (DISPOSABLE) ×2 IMPLANT
SPONGE SURGIFOAM ABS GEL SZ50 (HEMOSTASIS) IMPLANT
STAPLER SKIN PROX WIDE 3.9 (STAPLE) ×2 IMPLANT
STRIP CLOSURE SKIN 1/2X4 (GAUZE/BANDAGES/DRESSINGS) IMPLANT
SUT MNCRL AB 4-0 PS2 18 (SUTURE) IMPLANT
SUT SILK 0 (SUTURE) ×1
SUT SILK 0 MO-6 18XCR BRD 8 (SUTURE) ×1 IMPLANT
SUT SILK 0 TIES 10X30 (SUTURE) IMPLANT
SUT SILK 2 0 TIES 10X30 (SUTURE) IMPLANT
SUT VIC AB 2-0 CP2 18 (SUTURE) ×4 IMPLANT
SYRINGE 10CC LL (SYRINGE) IMPLANT
TOWEL OR 17X24 6PK STRL BLUE (TOWEL DISPOSABLE) ×2 IMPLANT
TOWEL OR 17X26 10 PK STRL BLUE (TOWEL DISPOSABLE) ×2 IMPLANT
WATER STERILE IRR 1000ML POUR (IV SOLUTION) ×2 IMPLANT
YANKAUER SUCT BULB TIP NO VENT (SUCTIONS) ×2 IMPLANT

## 2013-08-25 NOTE — Anesthesia Postprocedure Evaluation (Signed)
  Anesthesia Post-op Note  Patient: Matthew Parsons  Procedure(s) Performed: Procedure(s) with comments: LUMBAR SPINAL CORD STIMULATOR INSERTION-GENERATOR REPLACEMENT  (N/A) - LUMBAR SPINAL CORD STIMULATOR INSERTION-GENERATOR REPLACEMENT   Patient Location: PACU  Anesthesia Type:MAC  Level of Consciousness: awake, alert , oriented and patient cooperative  Airway and Oxygen Therapy: Patient Spontanous Breathing  Post-op Pain: none  Post-op Assessment: Post-op Vital signs reviewed, Patient's Cardiovascular Status Stable, Respiratory Function Stable, Patent Airway, No signs of Nausea or vomiting and Pain level controlled  Post-op Vital Signs: Reviewed and stable  Complications: No apparent anesthesia complications

## 2013-08-25 NOTE — Op Note (Signed)
PREOP DX: 1) lumbago  2) lumbar radiculopathy  3) lumbar post-laminectomy syndrome  4) chronic pain  5) depleted SCS battery/generator POSTOP DX: 1) lumbago  2) lumbar radiculopathy  3) lumbar post-laminectomy syndrome  4) chronic pain  5) depleted SCS battery/generator PROCEDURES PERFORMED:1) removal and replacement of medtronic system IPG. Replacement: non-rechargable Serial #[id number] SURGEON:Kingstyn Deruiter  ASSISTANT: NONE  ANESTHESIA: MAC  EBL: <20cc  DESCRIPTION OF PROCEDURE: After a discussion of risks, benefits and alternatives, informed consent was obtained. The patient was taken to the OR, turned prone onto a Jackson table, all pressure points padded, SCD's placed, and an adequate plane of anesthesia induced. A timeout was taken to verify the correct patient, position, personnel, availability of appropriate equipment, and administration of perioperative antibiotics.  The thoracic and lumbar areas were widely prepped with chloraprep and draped into a sterile field. The previous IPG incision was infiltrated with 1% lidocaine and the skin incised sharply, and carried with great care down to the IPG itself. The IPG was delivered onto the field, and the leads removed. The new IPG was connected and impedances checked, revealing good contact throughout. The leads were then fixed into the new IPG with a self-torquing wrench. The pocket itself was then expanded using careful dissection to accomodate the new IPG, and the leads carefully placed anterior to the IPG in the pocket. The pocket was copiously irrigated with bacitracin containing solution, and then was closed with a deeper layer of 2-0 vicryl interrupted sutures, The skin closed with staples. Bacitracin and sterile dressings were applied. Needle, sponge, and instrument counts were correct x2 at the end of the case.  The patient was then carefully awakened from anesthesia, turned supine, an abdominal binder placed, and the patient taken to the  recovery room where he underwent complex spinal cord stimulator programming.  COMPLICATIONS: NONE  CONDITION: Stable throughout the course of the procedure and immediately afterward. FSBG during the case was 155.  DISPOSITION: discharge to home, with antibiotics and pain medicine. Discussed care with the patient and family member. Followup in clinic will be scheduled in 10-14 days.

## 2013-08-25 NOTE — H&P (Signed)
Matthew Parsons is an 80 y.o. male.   Chief Complaint: back pain with radiation into the legs HPI: patient with previously placed SCS for lumbar post-laminectomy syndrome, now with depleted battery.   Past Medical History  Diagnosis Date  . Diabetes mellitus without complication   . Arthritis   . Hyperlipidemia   . Hypertension   . Thyroid disease   . Inguinal hernia   . Pericarditis     H/O pericarditis  . Diverticulosis     History of Diverticulitis  . Pain in limb   . Other chronic pain   . Type II or unspecified type diabetes mellitus without mention of complication, not stated as uncontrolled   . Hypothyroidism   . Stroke     ? 06/25/2005   . Chronic kidney disease     frequency   . H/O hiatal hernia   . Cancer     hx of skin cancers   . Anemia   . Diarrhea     Past Surgical History  Procedure Laterality Date  . Tonsilectomy, adenoidectomy, bilateral myringotomy and tubes  1936  . Prostate trim  1985  . Spine surgery    . Spinal cord stimulator implant    . Inguinal hernia repair  04/06/2012    Procedure: HERNIA REPAIR INGUINAL ADULT;  Surgeon: Madilyn Hook, DO;  Location: [facility];  Service: General;  Laterality: Left;  . Insertion of mesh  04/06/2012    Procedure: INSERTION OF MESH;  Surgeon: Madilyn Hook, DO;  Location: [facility];  Service: General;  Laterality: Left;  . Joint replacement  1999    Broken arm, crushed wrist & broken fingers  . Cholecystectomy    . Colonoscopy w/ polypectomy    . Cataract extraction Bilateral   . Hernia repair      X 2  . Tonsillectomy    . Eye surgery      cataracts    History reviewed. No pertinent family history. Social History:  reports that he has never smoked. He has never used smokeless tobacco. He reports that he does not drink alcohol or use illicit drugs.  Allergies:  Allergies  Allergen Reactions  . Hydrocodone     "makes me crazy"  . Methylprednisolone     unknown    Medications Prior to Admission   Medication Sig Dispense Refill  . aspirin 81 MG tablet Take 81 mg by mouth every morning.       . Calcium-Cholecalciferol (CALCET PETITES) 200-250 MG-UNIT TABS Take 2 tablets by mouth 2 (two) times daily.       . Cholecalciferol (VITAMIN D3) 2000 UNITS TABS Take 1 tablet by mouth daily.      . Cyanocobalamin (VITAMIN B 12 PO) Take 1 capsule by mouth daily.      . fentaNYL (DURAGESIC - DOSED MCG/HR) 12 MCG/HR Place 12.5 mcg onto the skin every 3 (three) days.      . finasteride (PROSCAR) 5 MG tablet Take 5 mg by mouth daily.      Marland Kitchen gabapentin (NEURONTIN) 600 MG tablet Take 600 mg by mouth 2 (two) times daily.      Marland Kitchen glimepiride (AMARYL) 2 MG tablet Take 2 mg by mouth daily before breakfast.      . iron polysaccharides (NIFEREX) 150 MG capsule Take 150 mg by mouth 2 (two) times daily.      Marland Kitchen levothyroxine (SYNTHROID, LEVOTHROID) 125 MCG tablet Take 125 mcg by mouth daily before breakfast.      .  Multiple Vitamins-Minerals (CENTRUM SILVER ADULT 50+) TABS Take 1 tablet by mouth daily.      Vladimir Faster Glycol-Propyl Glycol (SYSTANE) 0.4-0.3 % SOLN Apply 1 drop to eye 2 (two) times daily. Dry eyes      . rosuvastatin (CRESTOR) 5 MG tablet Take 5 mg by mouth every morning.       . silodosin (RAPAFLO) 8 MG CAPS capsule Take 8 mg by mouth daily with breakfast.      . sitaGLIPtan-metformin (JANUMET) 50-1000 MG per tablet Take 1 tablet by mouth 2 (two) times daily with a meal.      . vitamin C (ASCORBIC ACID) 500 MG tablet Take 500 mg by mouth daily.        Results for orders placed during the hospital encounter of 08/25/13 (from the past 48 hour(s))  GLUCOSE, CAPILLARY     Status: Abnormal   Collection Time    08/25/13  8:30 AM      Result Value Ref Range   Glucose-Capillary 157 (*) 70 - 99 mg/dL   No results found.  Review of Systems  Constitutional: Negative.   HENT: Positive for hearing loss. Negative for congestion, ear discharge, ear pain, nosebleeds and sore throat.   Eyes: Negative.    Respiratory: Negative.  Negative for stridor.   Gastrointestinal: Negative.   Musculoskeletal: Positive for back pain. Negative for falls and myalgias.  Skin: Negative.   Neurological: Negative.   Endo/Heme/Allergies: Negative.   Psychiatric/Behavioral: Negative.     Blood pressure 140/74, pulse 67, temperature 97.4 F (36.3 C), temperature source Oral, resp. rate 17, SpO2 99.00%. Physical Exam  Constitutional: He is oriented to person, place, and time. He appears well-developed and well-nourished.  Eyes: EOM are normal. Pupils are equal, round, and reactive to light.  Cardiovascular: Normal rate and regular rhythm.   Respiratory: Effort normal and breath sounds normal.  Musculoskeletal: Normal range of motion.  Neurological: He is alert and oriented to person, place, and time.  Skin: Skin is warm and dry.  Psychiatric: He has a normal mood and affect. His behavior is normal. Judgment and thought content normal.     Assessment/Plan A: lumbar pain, lumbosacral radiculopathy, lumbar post-laminectomy syndrome, SCS status with depeleted IPG  P: replace battery, outpatient surgery  Bonna Gains 08/25/2013, 10:25 AM

## 2013-08-25 NOTE — Transfer of Care (Signed)
Immediate Anesthesia Transfer of Care Note  Patient: Matthew Parsons  Procedure(s) Performed: Procedure(s) with comments: LUMBAR SPINAL CORD STIMULATOR INSERTION-GENERATOR REPLACEMENT  (N/A) - LUMBAR SPINAL CORD STIMULATOR INSERTION-GENERATOR REPLACEMENT   Patient Location: PACU  Anesthesia Type:MAC  Level of Consciousness: awake  Airway & Oxygen Therapy: Patient Spontanous Breathing  Post-op Assessment: Report given to PACU RN  Post vital signs: Reviewed and stable  Complications: No apparent anesthesia complications

## 2013-08-25 NOTE — Preoperative (Addendum)
Beta Blockers   Reason not to administer Beta Blockers:Not Applicable 

## 2013-08-25 NOTE — Anesthesia Preprocedure Evaluation (Addendum)
Anesthesia Evaluation  Patient identified by MRN, date of birth, ID band Patient awake    Reviewed: Allergy & Precautions, H&P , NPO status   History of Anesthesia Complications Negative for: history of anesthetic complications  Airway Mallampati: II TM Distance: >3 FB Neck ROM: Full    Dental  (+) Dental Advisory Given   Pulmonary neg pulmonary ROS,  breath sounds clear to auscultation  Pulmonary exam normal       Cardiovascular hypertension (no meds), - anginaRhythm:Regular Rate:Normal     Neuro/Psych Chronic back pain: narcotics Diabetic peripheral neuropathy TIA   GI/Hepatic Neg liver ROS, hiatal hernia,   Endo/Other  diabetes (glu 157), Oral Hypoglycemic AgentsHypothyroidism   Renal/GU      Musculoskeletal   Abdominal   Peds  Hematology negative hematology ROS (+)   Anesthesia Other Findings   Reproductive/Obstetrics                         Anesthesia Physical Anesthesia Plan  ASA: III  Anesthesia Plan: MAC   Post-op Pain Management:    Induction: Intravenous  Airway Management Planned: Natural Airway and Nasal Cannula  Additional Equipment:   Intra-op Plan:   Post-operative Plan:   Informed Consent: I have reviewed the patients History and Physical, chart, labs and discussed the procedure including the risks, benefits and alternatives for the proposed anesthesia with the patient or authorized representative who has indicated his/her understanding and acceptance.   Dental advisory given  Plan Discussed with: CRNA and Surgeon  Anesthesia Plan Comments: (Plan routine monitors, MAC)        Anesthesia Quick Evaluation

## 2013-08-28 DIAGNOSIS — E1149 Type 2 diabetes mellitus with other diabetic neurological complication: Secondary | ICD-10-CM | POA: Diagnosis not present

## 2013-08-28 DIAGNOSIS — E1142 Type 2 diabetes mellitus with diabetic polyneuropathy: Secondary | ICD-10-CM | POA: Diagnosis not present

## 2013-08-28 DIAGNOSIS — Z462 Encounter for fitting and adjustment of other devices related to nervous system and special senses: Secondary | ICD-10-CM | POA: Diagnosis not present

## 2013-09-07 ENCOUNTER — Encounter (HOSPITAL_COMMUNITY): Payer: Self-pay | Admitting: Anesthesiology

## 2013-09-12 DIAGNOSIS — Z462 Encounter for fitting and adjustment of other devices related to nervous system and special senses: Secondary | ICD-10-CM | POA: Diagnosis not present

## 2013-09-12 DIAGNOSIS — E1149 Type 2 diabetes mellitus with other diabetic neurological complication: Secondary | ICD-10-CM | POA: Diagnosis not present

## 2013-09-21 DIAGNOSIS — Z79899 Other long term (current) drug therapy: Secondary | ICD-10-CM | POA: Diagnosis not present

## 2013-09-21 DIAGNOSIS — E785 Hyperlipidemia, unspecified: Secondary | ICD-10-CM | POA: Diagnosis not present

## 2013-09-21 DIAGNOSIS — E1065 Type 1 diabetes mellitus with hyperglycemia: Secondary | ICD-10-CM | POA: Diagnosis not present

## 2013-09-21 DIAGNOSIS — D509 Iron deficiency anemia, unspecified: Secondary | ICD-10-CM | POA: Diagnosis not present

## 2013-09-21 DIAGNOSIS — E1039 Type 1 diabetes mellitus with other diabetic ophthalmic complication: Secondary | ICD-10-CM | POA: Diagnosis not present

## 2013-09-21 DIAGNOSIS — E039 Hypothyroidism, unspecified: Secondary | ICD-10-CM | POA: Diagnosis not present

## 2013-09-21 DIAGNOSIS — I10 Essential (primary) hypertension: Secondary | ICD-10-CM | POA: Diagnosis not present

## 2013-09-21 DIAGNOSIS — E11319 Type 2 diabetes mellitus with unspecified diabetic retinopathy without macular edema: Secondary | ICD-10-CM | POA: Diagnosis not present

## 2013-09-21 DIAGNOSIS — Z23 Encounter for immunization: Secondary | ICD-10-CM | POA: Diagnosis not present

## 2013-09-21 DIAGNOSIS — E1149 Type 2 diabetes mellitus with other diabetic neurological complication: Secondary | ICD-10-CM | POA: Diagnosis not present

## 2013-11-21 DIAGNOSIS — R159 Full incontinence of feces: Secondary | ICD-10-CM | POA: Diagnosis not present

## 2013-12-05 DIAGNOSIS — E1149 Type 2 diabetes mellitus with other diabetic neurological complication: Secondary | ICD-10-CM | POA: Diagnosis not present

## 2013-12-05 DIAGNOSIS — E1142 Type 2 diabetes mellitus with diabetic polyneuropathy: Secondary | ICD-10-CM | POA: Diagnosis not present

## 2013-12-22 DIAGNOSIS — N401 Enlarged prostate with lower urinary tract symptoms: Secondary | ICD-10-CM | POA: Diagnosis not present

## 2013-12-22 DIAGNOSIS — E1149 Type 2 diabetes mellitus with other diabetic neurological complication: Secondary | ICD-10-CM | POA: Diagnosis not present

## 2013-12-22 DIAGNOSIS — E11319 Type 2 diabetes mellitus with unspecified diabetic retinopathy without macular edema: Secondary | ICD-10-CM | POA: Diagnosis not present

## 2013-12-22 DIAGNOSIS — K219 Gastro-esophageal reflux disease without esophagitis: Secondary | ICD-10-CM | POA: Diagnosis not present

## 2013-12-22 DIAGNOSIS — N138 Other obstructive and reflux uropathy: Secondary | ICD-10-CM | POA: Diagnosis not present

## 2013-12-22 DIAGNOSIS — E039 Hypothyroidism, unspecified: Secondary | ICD-10-CM | POA: Diagnosis not present

## 2013-12-22 DIAGNOSIS — Z79899 Other long term (current) drug therapy: Secondary | ICD-10-CM | POA: Diagnosis not present

## 2013-12-22 DIAGNOSIS — D509 Iron deficiency anemia, unspecified: Secondary | ICD-10-CM | POA: Diagnosis not present

## 2013-12-22 DIAGNOSIS — Z1331 Encounter for screening for depression: Secondary | ICD-10-CM | POA: Diagnosis not present

## 2014-01-18 DIAGNOSIS — E119 Type 2 diabetes mellitus without complications: Secondary | ICD-10-CM | POA: Diagnosis not present

## 2014-01-24 DIAGNOSIS — L821 Other seborrheic keratosis: Secondary | ICD-10-CM | POA: Diagnosis not present

## 2014-01-24 DIAGNOSIS — L98499 Non-pressure chronic ulcer of skin of other sites with unspecified severity: Secondary | ICD-10-CM | POA: Diagnosis not present

## 2014-01-24 DIAGNOSIS — L570 Actinic keratosis: Secondary | ICD-10-CM | POA: Diagnosis not present

## 2014-01-29 DIAGNOSIS — R159 Full incontinence of feces: Secondary | ICD-10-CM | POA: Diagnosis not present

## 2014-03-05 DIAGNOSIS — Z125 Encounter for screening for malignant neoplasm of prostate: Secondary | ICD-10-CM | POA: Diagnosis not present

## 2014-03-05 DIAGNOSIS — E785 Hyperlipidemia, unspecified: Secondary | ICD-10-CM | POA: Diagnosis not present

## 2014-03-05 DIAGNOSIS — E1149 Type 2 diabetes mellitus with other diabetic neurological complication: Secondary | ICD-10-CM | POA: Diagnosis not present

## 2014-03-05 DIAGNOSIS — E039 Hypothyroidism, unspecified: Secondary | ICD-10-CM | POA: Diagnosis not present

## 2014-03-05 DIAGNOSIS — R972 Elevated prostate specific antigen [PSA]: Secondary | ICD-10-CM | POA: Diagnosis not present

## 2014-03-05 DIAGNOSIS — I10 Essential (primary) hypertension: Secondary | ICD-10-CM | POA: Diagnosis not present

## 2014-03-06 DIAGNOSIS — E1142 Type 2 diabetes mellitus with diabetic polyneuropathy: Secondary | ICD-10-CM | POA: Diagnosis not present

## 2014-03-06 DIAGNOSIS — E1149 Type 2 diabetes mellitus with other diabetic neurological complication: Secondary | ICD-10-CM | POA: Diagnosis not present

## 2014-03-12 DIAGNOSIS — F5104 Psychophysiologic insomnia: Secondary | ICD-10-CM | POA: Diagnosis not present

## 2014-03-12 DIAGNOSIS — N182 Chronic kidney disease, stage 2 (mild): Secondary | ICD-10-CM | POA: Diagnosis not present

## 2014-03-12 DIAGNOSIS — R278 Other lack of coordination: Secondary | ICD-10-CM | POA: Diagnosis not present

## 2014-03-12 DIAGNOSIS — Z602 Problems related to living alone: Secondary | ICD-10-CM | POA: Diagnosis not present

## 2014-03-12 DIAGNOSIS — E11319 Type 2 diabetes mellitus with unspecified diabetic retinopathy without macular edema: Secondary | ICD-10-CM | POA: Diagnosis not present

## 2014-03-12 DIAGNOSIS — Z23 Encounter for immunization: Secondary | ICD-10-CM | POA: Diagnosis not present

## 2014-03-12 DIAGNOSIS — E1149 Type 2 diabetes mellitus with other diabetic neurological complication: Secondary | ICD-10-CM | POA: Diagnosis not present

## 2014-03-12 DIAGNOSIS — R159 Full incontinence of feces: Secondary | ICD-10-CM | POA: Diagnosis not present

## 2014-03-12 DIAGNOSIS — M6281 Muscle weakness (generalized): Secondary | ICD-10-CM | POA: Diagnosis not present

## 2014-03-12 DIAGNOSIS — R809 Proteinuria, unspecified: Secondary | ICD-10-CM | POA: Diagnosis not present

## 2014-03-12 DIAGNOSIS — Z0000 Encounter for general adult medical examination without abnormal findings: Secondary | ICD-10-CM | POA: Diagnosis not present

## 2014-03-12 DIAGNOSIS — R972 Elevated prostate specific antigen [PSA]: Secondary | ICD-10-CM | POA: Diagnosis not present

## 2014-03-14 DIAGNOSIS — Z1212 Encounter for screening for malignant neoplasm of rectum: Secondary | ICD-10-CM | POA: Diagnosis not present

## 2014-04-09 DIAGNOSIS — R278 Other lack of coordination: Secondary | ICD-10-CM | POA: Diagnosis not present

## 2014-04-09 DIAGNOSIS — M6281 Muscle weakness (generalized): Secondary | ICD-10-CM | POA: Diagnosis not present

## 2014-04-09 DIAGNOSIS — R159 Full incontinence of feces: Secondary | ICD-10-CM | POA: Diagnosis not present

## 2014-04-25 DIAGNOSIS — R972 Elevated prostate specific antigen [PSA]: Secondary | ICD-10-CM | POA: Diagnosis not present

## 2014-05-08 DIAGNOSIS — R972 Elevated prostate specific antigen [PSA]: Secondary | ICD-10-CM | POA: Diagnosis not present

## 2014-05-30 DIAGNOSIS — Z6825 Body mass index (BMI) 25.0-25.9, adult: Secondary | ICD-10-CM | POA: Diagnosis not present

## 2014-05-30 DIAGNOSIS — Z9689 Presence of other specified functional implants: Secondary | ICD-10-CM | POA: Diagnosis not present

## 2014-05-30 DIAGNOSIS — E0842 Diabetes mellitus due to underlying condition with diabetic polyneuropathy: Secondary | ICD-10-CM | POA: Diagnosis not present

## 2014-06-06 DIAGNOSIS — R972 Elevated prostate specific antigen [PSA]: Secondary | ICD-10-CM | POA: Diagnosis not present

## 2014-06-06 DIAGNOSIS — C61 Malignant neoplasm of prostate: Secondary | ICD-10-CM | POA: Diagnosis not present

## 2014-06-14 ENCOUNTER — Other Ambulatory Visit (HOSPITAL_COMMUNITY): Payer: Self-pay | Admitting: Urology

## 2014-06-14 DIAGNOSIS — C61 Malignant neoplasm of prostate: Secondary | ICD-10-CM

## 2014-06-20 DIAGNOSIS — F5104 Psychophysiologic insomnia: Secondary | ICD-10-CM | POA: Diagnosis not present

## 2014-06-20 DIAGNOSIS — E1149 Type 2 diabetes mellitus with other diabetic neurological complication: Secondary | ICD-10-CM | POA: Diagnosis not present

## 2014-06-20 DIAGNOSIS — E039 Hypothyroidism, unspecified: Secondary | ICD-10-CM | POA: Diagnosis not present

## 2014-06-20 DIAGNOSIS — K219 Gastro-esophageal reflux disease without esophagitis: Secondary | ICD-10-CM | POA: Diagnosis not present

## 2014-06-20 DIAGNOSIS — E785 Hyperlipidemia, unspecified: Secondary | ICD-10-CM | POA: Diagnosis not present

## 2014-06-20 DIAGNOSIS — Z1389 Encounter for screening for other disorder: Secondary | ICD-10-CM | POA: Diagnosis not present

## 2014-06-20 DIAGNOSIS — Z6823 Body mass index (BMI) 23.0-23.9, adult: Secondary | ICD-10-CM | POA: Diagnosis not present

## 2014-06-20 DIAGNOSIS — R809 Proteinuria, unspecified: Secondary | ICD-10-CM | POA: Diagnosis not present

## 2014-06-20 DIAGNOSIS — F418 Other specified anxiety disorders: Secondary | ICD-10-CM | POA: Diagnosis not present

## 2014-06-20 DIAGNOSIS — N182 Chronic kidney disease, stage 2 (mild): Secondary | ICD-10-CM | POA: Diagnosis not present

## 2014-06-20 DIAGNOSIS — E11319 Type 2 diabetes mellitus with unspecified diabetic retinopathy without macular edema: Secondary | ICD-10-CM | POA: Diagnosis not present

## 2014-06-20 DIAGNOSIS — I10 Essential (primary) hypertension: Secondary | ICD-10-CM | POA: Diagnosis not present

## 2014-06-22 ENCOUNTER — Ambulatory Visit (HOSPITAL_COMMUNITY)
Admission: RE | Admit: 2014-06-22 | Discharge: 2014-06-22 | Disposition: A | Discharge status: Home / Self Care | Payer: Medicare Other | Source: Ambulatory Visit | Attending: Urology | Admitting: Urology

## 2014-06-22 DIAGNOSIS — C61 Malignant neoplasm of prostate: Secondary | ICD-10-CM

## 2014-06-22 MED ORDER — TECHNETIUM TC 99M MEDRONATE IV KIT
27.0000 | PACK | Freq: Once | INTRAVENOUS | Status: AC | PRN
  Administered 2014-06-22: 27 via INTRAVENOUS

## 2014-07-09 DIAGNOSIS — C61 Malignant neoplasm of prostate: Secondary | ICD-10-CM | POA: Diagnosis not present

## 2014-07-09 DIAGNOSIS — K449 Diaphragmatic hernia without obstruction or gangrene: Secondary | ICD-10-CM | POA: Diagnosis not present

## 2014-07-09 DIAGNOSIS — I700 Atherosclerosis of aorta: Secondary | ICD-10-CM | POA: Diagnosis not present

## 2014-07-09 DIAGNOSIS — R972 Elevated prostate specific antigen [PSA]: Secondary | ICD-10-CM | POA: Diagnosis not present

## 2014-07-11 DIAGNOSIS — C61 Malignant neoplasm of prostate: Secondary | ICD-10-CM | POA: Diagnosis not present

## 2014-08-13 DIAGNOSIS — C61 Malignant neoplasm of prostate: Secondary | ICD-10-CM | POA: Diagnosis not present

## 2014-08-22 DIAGNOSIS — Z9689 Presence of other specified functional implants: Secondary | ICD-10-CM | POA: Diagnosis not present

## 2014-08-22 DIAGNOSIS — E0842 Diabetes mellitus due to underlying condition with diabetic polyneuropathy: Secondary | ICD-10-CM | POA: Diagnosis not present

## 2014-10-01 DIAGNOSIS — E1149 Type 2 diabetes mellitus with other diabetic neurological complication: Secondary | ICD-10-CM | POA: Diagnosis not present

## 2014-10-01 DIAGNOSIS — E11319 Type 2 diabetes mellitus with unspecified diabetic retinopathy without macular edema: Secondary | ICD-10-CM | POA: Diagnosis not present

## 2014-10-01 DIAGNOSIS — Z6823 Body mass index (BMI) 23.0-23.9, adult: Secondary | ICD-10-CM | POA: Diagnosis not present

## 2014-10-01 DIAGNOSIS — M79606 Pain in leg, unspecified: Secondary | ICD-10-CM | POA: Diagnosis not present

## 2014-10-01 DIAGNOSIS — N182 Chronic kidney disease, stage 2 (mild): Secondary | ICD-10-CM | POA: Diagnosis not present

## 2014-10-01 DIAGNOSIS — F418 Other specified anxiety disorders: Secondary | ICD-10-CM | POA: Diagnosis not present

## 2014-10-01 DIAGNOSIS — K219 Gastro-esophageal reflux disease without esophagitis: Secondary | ICD-10-CM | POA: Diagnosis not present

## 2014-10-01 DIAGNOSIS — D509 Iron deficiency anemia, unspecified: Secondary | ICD-10-CM | POA: Diagnosis not present

## 2014-10-01 DIAGNOSIS — R809 Proteinuria, unspecified: Secondary | ICD-10-CM | POA: Diagnosis not present

## 2014-10-01 DIAGNOSIS — Z79899 Other long term (current) drug therapy: Secondary | ICD-10-CM | POA: Diagnosis not present

## 2014-10-01 DIAGNOSIS — F5104 Psychophysiologic insomnia: Secondary | ICD-10-CM | POA: Diagnosis not present

## 2014-10-01 DIAGNOSIS — E039 Hypothyroidism, unspecified: Secondary | ICD-10-CM | POA: Diagnosis not present

## 2014-11-14 DIAGNOSIS — E0842 Diabetes mellitus due to underlying condition with diabetic polyneuropathy: Secondary | ICD-10-CM | POA: Diagnosis not present

## 2014-11-14 DIAGNOSIS — Z9689 Presence of other specified functional implants: Secondary | ICD-10-CM | POA: Diagnosis not present

## 2014-11-21 DIAGNOSIS — Z6822 Body mass index (BMI) 22.0-22.9, adult: Secondary | ICD-10-CM | POA: Diagnosis not present

## 2014-11-21 DIAGNOSIS — I10 Essential (primary) hypertension: Secondary | ICD-10-CM | POA: Diagnosis not present

## 2014-11-21 DIAGNOSIS — E1149 Type 2 diabetes mellitus with other diabetic neurological complication: Secondary | ICD-10-CM | POA: Diagnosis not present

## 2014-11-21 DIAGNOSIS — N182 Chronic kidney disease, stage 2 (mild): Secondary | ICD-10-CM | POA: Diagnosis not present

## 2014-12-12 DIAGNOSIS — C61 Malignant neoplasm of prostate: Secondary | ICD-10-CM | POA: Diagnosis not present

## 2014-12-12 DIAGNOSIS — C7951 Secondary malignant neoplasm of bone: Secondary | ICD-10-CM | POA: Diagnosis not present

## 2015-01-02 DIAGNOSIS — C61 Malignant neoplasm of prostate: Secondary | ICD-10-CM | POA: Diagnosis not present

## 2015-01-02 DIAGNOSIS — F418 Other specified anxiety disorders: Secondary | ICD-10-CM | POA: Diagnosis not present

## 2015-01-02 DIAGNOSIS — K219 Gastro-esophageal reflux disease without esophagitis: Secondary | ICD-10-CM | POA: Diagnosis not present

## 2015-01-02 DIAGNOSIS — Z6822 Body mass index (BMI) 22.0-22.9, adult: Secondary | ICD-10-CM | POA: Diagnosis not present

## 2015-01-02 DIAGNOSIS — D509 Iron deficiency anemia, unspecified: Secondary | ICD-10-CM | POA: Diagnosis not present

## 2015-01-02 DIAGNOSIS — N182 Chronic kidney disease, stage 2 (mild): Secondary | ICD-10-CM | POA: Diagnosis not present

## 2015-01-02 DIAGNOSIS — E1149 Type 2 diabetes mellitus with other diabetic neurological complication: Secondary | ICD-10-CM | POA: Diagnosis not present

## 2015-01-02 DIAGNOSIS — I10 Essential (primary) hypertension: Secondary | ICD-10-CM | POA: Diagnosis not present

## 2015-01-02 DIAGNOSIS — E039 Hypothyroidism, unspecified: Secondary | ICD-10-CM | POA: Diagnosis not present

## 2015-01-02 DIAGNOSIS — R809 Proteinuria, unspecified: Secondary | ICD-10-CM | POA: Diagnosis not present

## 2015-01-02 DIAGNOSIS — R159 Full incontinence of feces: Secondary | ICD-10-CM | POA: Diagnosis not present

## 2015-01-02 DIAGNOSIS — E785 Hyperlipidemia, unspecified: Secondary | ICD-10-CM | POA: Diagnosis not present

## 2015-02-13 DIAGNOSIS — E0842 Diabetes mellitus due to underlying condition with diabetic polyneuropathy: Secondary | ICD-10-CM | POA: Diagnosis not present

## 2015-02-13 DIAGNOSIS — Z9689 Presence of other specified functional implants: Secondary | ICD-10-CM | POA: Diagnosis not present

## 2015-03-12 DIAGNOSIS — E0842 Diabetes mellitus due to underlying condition with diabetic polyneuropathy: Secondary | ICD-10-CM | POA: Diagnosis not present

## 2015-03-15 DIAGNOSIS — E1149 Type 2 diabetes mellitus with other diabetic neurological complication: Secondary | ICD-10-CM | POA: Diagnosis not present

## 2015-03-15 DIAGNOSIS — E039 Hypothyroidism, unspecified: Secondary | ICD-10-CM | POA: Diagnosis not present

## 2015-03-15 DIAGNOSIS — E785 Hyperlipidemia, unspecified: Secondary | ICD-10-CM | POA: Diagnosis not present

## 2015-03-22 DIAGNOSIS — Z0000 Encounter for general adult medical examination without abnormal findings: Secondary | ICD-10-CM | POA: Diagnosis not present

## 2015-03-22 DIAGNOSIS — Z23 Encounter for immunization: Secondary | ICD-10-CM | POA: Diagnosis not present

## 2015-03-22 DIAGNOSIS — N182 Chronic kidney disease, stage 2 (mild): Secondary | ICD-10-CM | POA: Diagnosis not present

## 2015-03-22 DIAGNOSIS — Z6822 Body mass index (BMI) 22.0-22.9, adult: Secondary | ICD-10-CM | POA: Diagnosis not present

## 2015-03-22 DIAGNOSIS — R159 Full incontinence of feces: Secondary | ICD-10-CM | POA: Diagnosis not present

## 2015-03-22 DIAGNOSIS — E11319 Type 2 diabetes mellitus with unspecified diabetic retinopathy without macular edema: Secondary | ICD-10-CM | POA: Diagnosis not present

## 2015-03-22 DIAGNOSIS — I129 Hypertensive chronic kidney disease with stage 1 through stage 4 chronic kidney disease, or unspecified chronic kidney disease: Secondary | ICD-10-CM | POA: Diagnosis not present

## 2015-03-22 DIAGNOSIS — C61 Malignant neoplasm of prostate: Secondary | ICD-10-CM | POA: Diagnosis not present

## 2015-03-22 DIAGNOSIS — R809 Proteinuria, unspecified: Secondary | ICD-10-CM | POA: Diagnosis not present

## 2015-03-22 DIAGNOSIS — Z1389 Encounter for screening for other disorder: Secondary | ICD-10-CM | POA: Diagnosis not present

## 2015-03-22 DIAGNOSIS — R42 Dizziness and giddiness: Secondary | ICD-10-CM | POA: Diagnosis not present

## 2015-03-22 DIAGNOSIS — E1149 Type 2 diabetes mellitus with other diabetic neurological complication: Secondary | ICD-10-CM | POA: Diagnosis not present

## 2015-04-15 DIAGNOSIS — C61 Malignant neoplasm of prostate: Secondary | ICD-10-CM | POA: Diagnosis not present

## 2015-04-15 DIAGNOSIS — C7951 Secondary malignant neoplasm of bone: Secondary | ICD-10-CM | POA: Diagnosis not present

## 2015-05-29 DIAGNOSIS — R05 Cough: Secondary | ICD-10-CM | POA: Diagnosis not present

## 2015-05-29 DIAGNOSIS — J209 Acute bronchitis, unspecified: Secondary | ICD-10-CM | POA: Diagnosis not present

## 2015-05-29 DIAGNOSIS — Z6823 Body mass index (BMI) 23.0-23.9, adult: Secondary | ICD-10-CM | POA: Diagnosis not present

## 2015-06-04 DIAGNOSIS — I10 Essential (primary) hypertension: Secondary | ICD-10-CM | POA: Diagnosis not present

## 2015-06-04 DIAGNOSIS — Z9689 Presence of other specified functional implants: Secondary | ICD-10-CM | POA: Diagnosis not present

## 2015-06-04 DIAGNOSIS — E0842 Diabetes mellitus due to underlying condition with diabetic polyneuropathy: Secondary | ICD-10-CM | POA: Diagnosis not present

## 2015-06-06 DIAGNOSIS — H524 Presbyopia: Secondary | ICD-10-CM | POA: Diagnosis not present

## 2015-06-06 DIAGNOSIS — E119 Type 2 diabetes mellitus without complications: Secondary | ICD-10-CM | POA: Diagnosis not present

## 2015-06-25 DIAGNOSIS — I10 Essential (primary) hypertension: Secondary | ICD-10-CM | POA: Diagnosis not present

## 2015-06-25 DIAGNOSIS — N401 Enlarged prostate with lower urinary tract symptoms: Secondary | ICD-10-CM | POA: Diagnosis not present

## 2015-06-25 DIAGNOSIS — F418 Other specified anxiety disorders: Secondary | ICD-10-CM | POA: Diagnosis not present

## 2015-06-25 DIAGNOSIS — M79606 Pain in leg, unspecified: Secondary | ICD-10-CM | POA: Diagnosis not present

## 2015-06-25 DIAGNOSIS — I129 Hypertensive chronic kidney disease with stage 1 through stage 4 chronic kidney disease, or unspecified chronic kidney disease: Secondary | ICD-10-CM | POA: Diagnosis not present

## 2015-06-25 DIAGNOSIS — Z1389 Encounter for screening for other disorder: Secondary | ICD-10-CM | POA: Diagnosis not present

## 2015-06-25 DIAGNOSIS — E1149 Type 2 diabetes mellitus with other diabetic neurological complication: Secondary | ICD-10-CM | POA: Diagnosis not present

## 2015-06-25 DIAGNOSIS — Z6822 Body mass index (BMI) 22.0-22.9, adult: Secondary | ICD-10-CM | POA: Diagnosis not present

## 2015-06-25 DIAGNOSIS — E038 Other specified hypothyroidism: Secondary | ICD-10-CM | POA: Diagnosis not present

## 2015-06-25 DIAGNOSIS — K219 Gastro-esophageal reflux disease without esophagitis: Secondary | ICD-10-CM | POA: Diagnosis not present

## 2015-06-25 DIAGNOSIS — E7800 Pure hypercholesterolemia, unspecified: Secondary | ICD-10-CM | POA: Diagnosis not present

## 2015-06-25 DIAGNOSIS — D509 Iron deficiency anemia, unspecified: Secondary | ICD-10-CM | POA: Diagnosis not present

## 2015-08-07 DIAGNOSIS — L821 Other seborrheic keratosis: Secondary | ICD-10-CM | POA: Diagnosis not present

## 2015-08-07 DIAGNOSIS — L570 Actinic keratosis: Secondary | ICD-10-CM | POA: Diagnosis not present

## 2015-08-07 DIAGNOSIS — C4442 Squamous cell carcinoma of skin of scalp and neck: Secondary | ICD-10-CM | POA: Diagnosis not present

## 2015-08-07 DIAGNOSIS — D485 Neoplasm of uncertain behavior of skin: Secondary | ICD-10-CM | POA: Diagnosis not present

## 2015-08-07 DIAGNOSIS — C4440 Unspecified malignant neoplasm of skin of scalp and neck: Secondary | ICD-10-CM | POA: Diagnosis not present

## 2015-08-07 DIAGNOSIS — L905 Scar conditions and fibrosis of skin: Secondary | ICD-10-CM | POA: Diagnosis not present

## 2015-08-09 DIAGNOSIS — C61 Malignant neoplasm of prostate: Secondary | ICD-10-CM | POA: Diagnosis not present

## 2015-08-12 DIAGNOSIS — C61 Malignant neoplasm of prostate: Secondary | ICD-10-CM | POA: Diagnosis not present

## 2015-08-12 DIAGNOSIS — Z0000 Encounter for general adult medical examination without abnormal findings: Secondary | ICD-10-CM | POA: Diagnosis not present

## 2015-09-02 DIAGNOSIS — Z9689 Presence of other specified functional implants: Secondary | ICD-10-CM | POA: Diagnosis not present

## 2015-09-02 DIAGNOSIS — E0842 Diabetes mellitus due to underlying condition with diabetic polyneuropathy: Secondary | ICD-10-CM | POA: Diagnosis not present

## 2015-09-11 DIAGNOSIS — Z85828 Personal history of other malignant neoplasm of skin: Secondary | ICD-10-CM | POA: Diagnosis not present

## 2015-09-17 DIAGNOSIS — S60511A Abrasion of right hand, initial encounter: Secondary | ICD-10-CM | POA: Diagnosis not present

## 2015-09-30 DIAGNOSIS — F418 Other specified anxiety disorders: Secondary | ICD-10-CM | POA: Diagnosis not present

## 2015-09-30 DIAGNOSIS — E1149 Type 2 diabetes mellitus with other diabetic neurological complication: Secondary | ICD-10-CM | POA: Diagnosis not present

## 2015-09-30 DIAGNOSIS — I129 Hypertensive chronic kidney disease with stage 1 through stage 4 chronic kidney disease, or unspecified chronic kidney disease: Secondary | ICD-10-CM | POA: Diagnosis not present

## 2015-09-30 DIAGNOSIS — Z1389 Encounter for screening for other disorder: Secondary | ICD-10-CM | POA: Diagnosis not present

## 2015-09-30 DIAGNOSIS — D508 Other iron deficiency anemias: Secondary | ICD-10-CM | POA: Diagnosis not present

## 2015-09-30 DIAGNOSIS — N182 Chronic kidney disease, stage 2 (mild): Secondary | ICD-10-CM | POA: Diagnosis not present

## 2015-09-30 DIAGNOSIS — E038 Other specified hypothyroidism: Secondary | ICD-10-CM | POA: Diagnosis not present

## 2015-09-30 DIAGNOSIS — C61 Malignant neoplasm of prostate: Secondary | ICD-10-CM | POA: Diagnosis not present

## 2015-09-30 DIAGNOSIS — D509 Iron deficiency anemia, unspecified: Secondary | ICD-10-CM | POA: Diagnosis not present

## 2015-09-30 DIAGNOSIS — I10 Essential (primary) hypertension: Secondary | ICD-10-CM | POA: Diagnosis not present

## 2015-09-30 DIAGNOSIS — Z6822 Body mass index (BMI) 22.0-22.9, adult: Secondary | ICD-10-CM | POA: Diagnosis not present

## 2015-09-30 DIAGNOSIS — R808 Other proteinuria: Secondary | ICD-10-CM | POA: Diagnosis not present

## 2015-10-18 ENCOUNTER — Ambulatory Visit (INDEPENDENT_AMBULATORY_CARE_PROVIDER_SITE_OTHER): Payer: Medicare Other | Admitting: Physician Assistant

## 2015-10-18 ENCOUNTER — Ambulatory Visit (INDEPENDENT_AMBULATORY_CARE_PROVIDER_SITE_OTHER): Payer: Medicare Other

## 2015-10-18 VITALS — BP 116/72 | HR 92 | Temp 98.1°F | Resp 16

## 2015-10-18 DIAGNOSIS — S99911A Unspecified injury of right ankle, initial encounter: Secondary | ICD-10-CM

## 2015-10-18 DIAGNOSIS — S82391A Other fracture of lower end of right tibia, initial encounter for closed fracture: Secondary | ICD-10-CM | POA: Diagnosis not present

## 2015-10-18 DIAGNOSIS — S82491A Other fracture of shaft of right fibula, initial encounter for closed fracture: Secondary | ICD-10-CM | POA: Diagnosis not present

## 2015-10-18 DIAGNOSIS — S82431A Displaced oblique fracture of shaft of right fibula, initial encounter for closed fracture: Secondary | ICD-10-CM | POA: Diagnosis not present

## 2015-10-18 DIAGNOSIS — S82831A Other fracture of upper and lower end of right fibula, initial encounter for closed fracture: Secondary | ICD-10-CM

## 2015-10-18 NOTE — Progress Notes (Signed)
[facility] [address] [place] [address] [phone]  Date:  10/18/2015   Name:  Matthew Parsons   DOB:  08/04/1926   MRN:  [id number]  PCP:  Haywood Pao, MD    Chief Complaint: Fall   History of Present Illness:  This is a 82 y.o. male with PMH DM2, neuropathy, HTN, HLD, hypothyroid, chronic pain, prostate cancer who is presenting after a fall last night. States he was sitting on the edge of the bed. States he must not have been back far enough. When he went to stand up he slipped and fell. States all of his weight went onto his lateral ankle. Having a lot of pain today. Needed wheelchair to get to room in clinic today. No known osteoporosis. He does have prostate cancer and gets injections for this every 4 months. He already has an appt with ortho, Dr. Durward Fortes, on Monday.  Review of Systems:  Review of Systems See HPI  There are no active problems to display for this patient.   Prior to Admission medications   Medication Sig Start Date End Date Taking? Authorizing Provider  aspirin 81 MG tablet Take 81 mg by mouth every morning.    Yes Historical Provider, MD  Calcium-Cholecalciferol (CALCET PETITES) 200-250 MG-UNIT TABS Take 2 tablets by mouth 2 (two) times daily.    Yes Historical Provider, MD  Cholecalciferol (VITAMIN D3) 2000 UNITS TABS Take 1 tablet by mouth daily.   Yes Historical Provider, MD  Cyanocobalamin (VITAMIN B 12 PO) Take 1 capsule by mouth daily.   Yes Historical Provider, MD  finasteride (PROSCAR) 5 MG tablet Take 5 mg by mouth daily.   Yes Historical Provider, MD  gabapentin (NEURONTIN) 600 MG tablet Take 600 mg by mouth 2 (two) times daily.   Yes Historical Provider, MD  glimepiride (AMARYL) 2 MG tablet Take 2 mg by mouth daily before breakfast.   Yes Historical Provider, MD  iron polysaccharides (NIFEREX) 150 MG capsule Take 150 mg by mouth 2 (two) times daily.   Yes Historical Provider, MD  levothyroxine (SYNTHROID,  LEVOTHROID) 125 MCG tablet Take 125 mcg by mouth daily before breakfast.   Yes Historical Provider, MD  Multiple Vitamins-Minerals (CENTRUM SILVER ADULT 50+) TABS Take 1 tablet by mouth daily.   Yes Historical Provider, MD  Polyethyl Glycol-Propyl Glycol (SYSTANE) 0.4-0.3 % SOLN Apply 1 drop to eye 2 (two) times daily. Dry eyes   Yes Historical Provider, MD  rosuvastatin (CRESTOR) 5 MG tablet Take 5 mg by mouth every morning.    Yes Historical Provider, MD  silodosin (RAPAFLO) 8 MG CAPS capsule Take 8 mg by mouth daily with breakfast.   Yes Historical Provider, MD  sitaGLIPtan-metformin (JANUMET) 50-1000 MG per tablet Take 1 tablet by mouth 2 (two) times daily with a meal.   Yes Historical Provider, MD  TRAMADOL HCL ER PO Take by mouth.   Yes Historical Provider, MD  vitamin C (ASCORBIC ACID) 500 MG tablet Take 500 mg by mouth daily.   Yes Historical Provider, MD  fentaNYL (DURAGESIC - DOSED MCG/HR) 12 MCG/HR Place 12.5 mcg onto the skin every 3 (three) days. Reported on 10/18/2015    Historical Provider, MD    Allergies  Allergen Reactions  . Hydrocodone     "makes me crazy"  . Methylprednisolone     unknown    Past Surgical History  Procedure Laterality Date  . Tonsilectomy, adenoidectomy, bilateral myringotomy and tubes  1936  .  Prostate trim  1985  . Spine surgery    . Spinal cord stimulator implant    . Inguinal hernia repair  04/06/2012    Procedure: HERNIA REPAIR INGUINAL ADULT;  Surgeon: Madilyn Hook, DO;  Location: [facility];  Service: General;  Laterality: Left;  . Insertion of mesh  04/06/2012    Procedure: INSERTION OF MESH;  Surgeon: Madilyn Hook, DO;  Location: [facility];  Service: General;  Laterality: Left;  . Joint replacement  1999    Broken arm, crushed wrist & broken fingers  . Cholecystectomy    . Colonoscopy w/ polypectomy    . Cataract extraction Bilateral   . Hernia repair      X 2  . Tonsillectomy    . Eye surgery      cataracts  . Spinal cord stimulator  insertion N/A 08/25/2013    Procedure: LUMBAR SPINAL CORD STIMULATOR INSERTION-GENERATOR REPLACEMENT ;  Surgeon: Bonna Gains, MD;  Location: [place] [facility];  Service: Neurosurgery;  Laterality: N/A;  LUMBAR SPINAL CORD STIMULATOR INSERTION-GENERATOR REPLACEMENT     Social History  Substance Use Topics  . Smoking status: Never Smoker   . Smokeless tobacco: Never Used  . Alcohol Use: No    History reviewed. No pertinent family history.  Medication list has been reviewed and updated.  Physical Examination:  Physical Exam  Constitutional: He is oriented to person, place, and time. He appears well-developed and well-nourished. No distress.  HENT:  Head: Normocephalic and atraumatic.  Right Ear: Hearing normal.  Left Ear: Hearing normal.  Nose: Nose normal.  Eyes: Conjunctivae and lids are normal. Right eye exhibits no discharge. Left eye exhibits no discharge. No scleral icterus.  Pulmonary/Chest: Effort normal. No respiratory distress.  Musculoskeletal:       Right ankle: He exhibits decreased range of motion and swelling. He exhibits normal pulse. Tenderness. Lateral malleolus tenderness found. No medial malleolus and no proximal fibula tenderness found. Achilles tendon normal.       Left ankle: Normal. No tenderness. Achilles tendon normal.       Right foot: There is tenderness (lateral foot) and swelling. There is normal range of motion and normal capillary refill.       Left foot: Normal.  Neurological: He is alert and oriented to person, place, and time.  Skin: Skin is warm, dry and intact.  Psychiatric: He has a normal mood and affect. His speech is normal and behavior is normal. Thought content normal.   BP 116/72 mmHg  Pulse 92  Temp(Src) 98.1 F (36.7 C) (Oral)  Resp 16  Dg Ankle Complete Right  10/18/2015  CLINICAL DATA:  Injury last night, initial encounter, lateral foot and ankle pain EXAM: RIGHT ANKLE - COMPLETE 3+ VIEW COMPARISON:  None. FINDINGS: Four views of the  right ankle submitted. There is diffuse osteopenia. There is oblique nondisplaced fracture in distal fibula metaphysis. Significant soft tissue swelling adjacent to lateral malleolus. Tiny avulsion fracture anterior aspect of distal tibia. Atherosclerotic vascular calcifications are noted. Ankle mortise is preserved. IMPRESSION: Oblique nondisplaced fracture in distal right fibula with adjacent soft tissue swelling. Tiny avulsion fracture anterior aspect of distal tibia. Diffuse osteopenia. Electronically Signed   By: Lahoma Crocker M.D.   On: 10/18/2015 18:24   Dg Foot Complete Right  10/18/2015  CLINICAL DATA:  Right ankle injury last night, initial encounter EXAM: RIGHT FOOT COMPLETE - 3+ VIEW COMPARISON:  Right ankle same day FINDINGS: Three views of the right foot submitted. No acute fracture  or subluxation. Mild degenerative changes first metatarsal phalangeal joint. Oblique nondisplaced fracture in distal right fibula. Tiny plantar spur of calcaneus. Diffuse osteopenia. Tiny avulsion fracture noted anterior aspect of distal tibia. IMPRESSION: No foot fracture or subluxation. Tiny avulsion fracture anterior aspect of distal tibia. Oblique nondisplaced fracture in distal fibula. Electronically Signed   By: Lahoma Crocker M.D.   On: 10/18/2015 18:26   Assessment and Plan:  1. Fracture of distal end of right fibula 2. Ankle injury, right, initial encounter Distal fibular fracture, stable. Pt would not tolerate non-weightbearing with crutches and splint. Placed in tall camwalker. He has a wheelchair and walker at home that he will use. Weight bear only if necessary. He has appt with ortho, Dr. Durward Fortes in 3 days. Counseled on RICE therapy. - DG Ankle Complete Right; Future - DG Foot Complete Right; Future   Benjaman Pott. Drenda Freeze, MHS [facility]  10/21/2015

## 2015-10-18 NOTE — Patient Instructions (Addendum)
No walking beyond what is necessary Rest, apply ice, elevate your leg. Be extra cautious with walking and transfers Follow up with Dr. Durward Fortes on Monday or Tuesday    IF you received an x-ray today, you will receive an invoice from [facility]. Please contact [facility] at [phone] with questions or concerns regarding your invoice.   IF you received labwork today, you will receive an invoice from [employer]. Please contact [facility] at [phone] with questions or concerns regarding your invoice.   Our billing staff will not be able to assist you with questions regarding bills from these companies.  You will be contacted with the lab results as soon as they are available. The fastest way to get your results is to activate your My Chart account. Instructions are located on the last page of this paperwork. If you have not heard from [place] regarding the results in 2 weeks, please contact this office.

## 2015-10-22 DIAGNOSIS — M6530 Trigger finger, unspecified finger: Secondary | ICD-10-CM | POA: Diagnosis not present

## 2015-10-22 DIAGNOSIS — S82301A Unspecified fracture of lower end of right tibia, initial encounter for closed fracture: Secondary | ICD-10-CM | POA: Diagnosis not present

## 2015-11-08 DIAGNOSIS — M65332 Trigger finger, left middle finger: Secondary | ICD-10-CM | POA: Diagnosis not present

## 2015-11-08 DIAGNOSIS — S82301D Unspecified fracture of lower end of right tibia, subsequent encounter for closed fracture with routine healing: Secondary | ICD-10-CM | POA: Diagnosis not present

## 2015-11-21 DIAGNOSIS — S82301D Unspecified fracture of lower end of right tibia, subsequent encounter for closed fracture with routine healing: Secondary | ICD-10-CM | POA: Diagnosis not present

## 2015-12-06 DIAGNOSIS — B351 Tinea unguium: Secondary | ICD-10-CM | POA: Diagnosis not present

## 2015-12-06 DIAGNOSIS — S82301D Unspecified fracture of lower end of right tibia, subsequent encounter for closed fracture with routine healing: Secondary | ICD-10-CM | POA: Diagnosis not present

## 2015-12-12 DIAGNOSIS — C61 Malignant neoplasm of prostate: Secondary | ICD-10-CM | POA: Diagnosis not present

## 2015-12-19 DIAGNOSIS — C61 Malignant neoplasm of prostate: Secondary | ICD-10-CM | POA: Diagnosis not present

## 2016-01-06 DIAGNOSIS — I8310 Varicose veins of unspecified lower extremity with inflammation: Secondary | ICD-10-CM | POA: Diagnosis not present

## 2016-01-06 DIAGNOSIS — Z85828 Personal history of other malignant neoplasm of skin: Secondary | ICD-10-CM | POA: Diagnosis not present

## 2016-01-06 DIAGNOSIS — L570 Actinic keratosis: Secondary | ICD-10-CM | POA: Diagnosis not present

## 2016-01-08 DIAGNOSIS — R808 Other proteinuria: Secondary | ICD-10-CM | POA: Diagnosis not present

## 2016-01-08 DIAGNOSIS — N401 Enlarged prostate with lower urinary tract symptoms: Secondary | ICD-10-CM | POA: Diagnosis not present

## 2016-01-08 DIAGNOSIS — Z6821 Body mass index (BMI) 21.0-21.9, adult: Secondary | ICD-10-CM | POA: Diagnosis not present

## 2016-01-08 DIAGNOSIS — C61 Malignant neoplasm of prostate: Secondary | ICD-10-CM | POA: Diagnosis not present

## 2016-01-08 DIAGNOSIS — N182 Chronic kidney disease, stage 2 (mild): Secondary | ICD-10-CM | POA: Diagnosis not present

## 2016-01-08 DIAGNOSIS — M79606 Pain in leg, unspecified: Secondary | ICD-10-CM | POA: Diagnosis not present

## 2016-01-08 DIAGNOSIS — E11319 Type 2 diabetes mellitus with unspecified diabetic retinopathy without macular edema: Secondary | ICD-10-CM | POA: Diagnosis not present

## 2016-01-08 DIAGNOSIS — I129 Hypertensive chronic kidney disease with stage 1 through stage 4 chronic kidney disease, or unspecified chronic kidney disease: Secondary | ICD-10-CM | POA: Diagnosis not present

## 2016-01-08 DIAGNOSIS — S82401S Unspecified fracture of shaft of right fibula, sequela: Secondary | ICD-10-CM | POA: Diagnosis not present

## 2016-01-08 DIAGNOSIS — E038 Other specified hypothyroidism: Secondary | ICD-10-CM | POA: Diagnosis not present

## 2016-01-08 DIAGNOSIS — E1149 Type 2 diabetes mellitus with other diabetic neurological complication: Secondary | ICD-10-CM | POA: Diagnosis not present

## 2016-01-08 DIAGNOSIS — F5104 Psychophysiologic insomnia: Secondary | ICD-10-CM | POA: Diagnosis not present

## 2016-03-19 DIAGNOSIS — Z8546 Personal history of malignant neoplasm of prostate: Secondary | ICD-10-CM | POA: Diagnosis not present

## 2016-03-23 DIAGNOSIS — E038 Other specified hypothyroidism: Secondary | ICD-10-CM | POA: Diagnosis not present

## 2016-03-23 DIAGNOSIS — E7800 Pure hypercholesterolemia, unspecified: Secondary | ICD-10-CM | POA: Diagnosis not present

## 2016-03-23 DIAGNOSIS — I10 Essential (primary) hypertension: Secondary | ICD-10-CM | POA: Diagnosis not present

## 2016-03-23 DIAGNOSIS — E1149 Type 2 diabetes mellitus with other diabetic neurological complication: Secondary | ICD-10-CM | POA: Diagnosis not present

## 2016-03-23 DIAGNOSIS — Z125 Encounter for screening for malignant neoplasm of prostate: Secondary | ICD-10-CM | POA: Diagnosis not present

## 2016-03-30 DIAGNOSIS — Z1389 Encounter for screening for other disorder: Secondary | ICD-10-CM | POA: Diagnosis not present

## 2016-03-30 DIAGNOSIS — E46 Unspecified protein-calorie malnutrition: Secondary | ICD-10-CM | POA: Diagnosis not present

## 2016-03-30 DIAGNOSIS — Z23 Encounter for immunization: Secondary | ICD-10-CM | POA: Diagnosis not present

## 2016-03-30 DIAGNOSIS — Z6821 Body mass index (BMI) 21.0-21.9, adult: Secondary | ICD-10-CM | POA: Diagnosis not present

## 2016-03-30 DIAGNOSIS — N182 Chronic kidney disease, stage 2 (mild): Secondary | ICD-10-CM | POA: Diagnosis not present

## 2016-03-30 DIAGNOSIS — C61 Malignant neoplasm of prostate: Secondary | ICD-10-CM | POA: Diagnosis not present

## 2016-03-30 DIAGNOSIS — Z0000 Encounter for general adult medical examination without abnormal findings: Secondary | ICD-10-CM | POA: Diagnosis not present

## 2016-03-30 DIAGNOSIS — R159 Full incontinence of feces: Secondary | ICD-10-CM | POA: Diagnosis not present

## 2016-03-30 DIAGNOSIS — I129 Hypertensive chronic kidney disease with stage 1 through stage 4 chronic kidney disease, or unspecified chronic kidney disease: Secondary | ICD-10-CM | POA: Diagnosis not present

## 2016-03-30 DIAGNOSIS — F5104 Psychophysiologic insomnia: Secondary | ICD-10-CM | POA: Diagnosis not present

## 2016-03-30 DIAGNOSIS — S82401S Unspecified fracture of shaft of right fibula, sequela: Secondary | ICD-10-CM | POA: Diagnosis not present

## 2016-03-30 DIAGNOSIS — E1149 Type 2 diabetes mellitus with other diabetic neurological complication: Secondary | ICD-10-CM | POA: Diagnosis not present

## 2016-03-31 DIAGNOSIS — C61 Malignant neoplasm of prostate: Secondary | ICD-10-CM | POA: Diagnosis not present

## 2016-05-05 DIAGNOSIS — E0842 Diabetes mellitus due to underlying condition with diabetic polyneuropathy: Secondary | ICD-10-CM | POA: Diagnosis not present

## 2016-06-11 DIAGNOSIS — H524 Presbyopia: Secondary | ICD-10-CM | POA: Diagnosis not present

## 2016-06-11 DIAGNOSIS — E119 Type 2 diabetes mellitus without complications: Secondary | ICD-10-CM | POA: Diagnosis not present

## 2016-06-11 DIAGNOSIS — Z961 Presence of intraocular lens: Secondary | ICD-10-CM | POA: Diagnosis not present

## 2016-06-29 DIAGNOSIS — Z8546 Personal history of malignant neoplasm of prostate: Secondary | ICD-10-CM | POA: Diagnosis not present

## 2016-08-05 DIAGNOSIS — E0842 Diabetes mellitus due to underlying condition with diabetic polyneuropathy: Secondary | ICD-10-CM | POA: Diagnosis not present

## 2016-09-15 DIAGNOSIS — Z8546 Personal history of malignant neoplasm of prostate: Secondary | ICD-10-CM | POA: Diagnosis not present

## 2016-09-21 DIAGNOSIS — R9721 Rising PSA following treatment for malignant neoplasm of prostate: Secondary | ICD-10-CM | POA: Diagnosis not present

## 2016-09-21 DIAGNOSIS — C61 Malignant neoplasm of prostate: Secondary | ICD-10-CM | POA: Diagnosis not present

## 2016-09-21 DIAGNOSIS — N138 Other obstructive and reflux uropathy: Secondary | ICD-10-CM | POA: Diagnosis not present

## 2016-09-28 DIAGNOSIS — C61 Malignant neoplasm of prostate: Secondary | ICD-10-CM | POA: Diagnosis not present

## 2016-09-28 DIAGNOSIS — Z5111 Encounter for antineoplastic chemotherapy: Secondary | ICD-10-CM | POA: Diagnosis not present

## 2016-11-04 DIAGNOSIS — E0842 Diabetes mellitus due to underlying condition with diabetic polyneuropathy: Secondary | ICD-10-CM | POA: Diagnosis not present

## 2016-12-28 DIAGNOSIS — C61 Malignant neoplasm of prostate: Secondary | ICD-10-CM | POA: Diagnosis not present

## 2016-12-31 DIAGNOSIS — Z6821 Body mass index (BMI) 21.0-21.9, adult: Secondary | ICD-10-CM | POA: Diagnosis not present

## 2016-12-31 DIAGNOSIS — S80819A Abrasion, unspecified lower leg, initial encounter: Secondary | ICD-10-CM | POA: Diagnosis not present

## 2016-12-31 DIAGNOSIS — B351 Tinea unguium: Secondary | ICD-10-CM | POA: Diagnosis not present

## 2016-12-31 DIAGNOSIS — Q808 Other congenital ichthyosis: Secondary | ICD-10-CM | POA: Diagnosis not present

## 2017-01-06 DIAGNOSIS — E038 Other specified hypothyroidism: Secondary | ICD-10-CM | POA: Diagnosis not present

## 2017-01-06 DIAGNOSIS — D508 Other iron deficiency anemias: Secondary | ICD-10-CM | POA: Diagnosis not present

## 2017-01-06 DIAGNOSIS — E11319 Type 2 diabetes mellitus with unspecified diabetic retinopathy without macular edema: Secondary | ICD-10-CM | POA: Diagnosis not present

## 2017-01-06 DIAGNOSIS — C61 Malignant neoplasm of prostate: Secondary | ICD-10-CM | POA: Diagnosis not present

## 2017-01-06 DIAGNOSIS — I129 Hypertensive chronic kidney disease with stage 1 through stage 4 chronic kidney disease, or unspecified chronic kidney disease: Secondary | ICD-10-CM | POA: Diagnosis not present

## 2017-01-06 DIAGNOSIS — F418 Other specified anxiety disorders: Secondary | ICD-10-CM | POA: Diagnosis not present

## 2017-01-06 DIAGNOSIS — N182 Chronic kidney disease, stage 2 (mild): Secondary | ICD-10-CM | POA: Diagnosis not present

## 2017-01-06 DIAGNOSIS — E7800 Pure hypercholesterolemia, unspecified: Secondary | ICD-10-CM | POA: Diagnosis not present

## 2017-01-06 DIAGNOSIS — Z6821 Body mass index (BMI) 21.0-21.9, adult: Secondary | ICD-10-CM | POA: Diagnosis not present

## 2017-01-06 DIAGNOSIS — E1149 Type 2 diabetes mellitus with other diabetic neurological complication: Secondary | ICD-10-CM | POA: Diagnosis not present

## 2017-01-06 DIAGNOSIS — R808 Other proteinuria: Secondary | ICD-10-CM | POA: Diagnosis not present

## 2017-01-06 DIAGNOSIS — S80819A Abrasion, unspecified lower leg, initial encounter: Secondary | ICD-10-CM | POA: Diagnosis not present

## 2017-01-14 DIAGNOSIS — M21962 Unspecified acquired deformity of left lower leg: Secondary | ICD-10-CM | POA: Diagnosis not present

## 2017-01-14 DIAGNOSIS — M21961 Unspecified acquired deformity of right lower leg: Secondary | ICD-10-CM | POA: Diagnosis not present

## 2017-01-14 DIAGNOSIS — I872 Venous insufficiency (chronic) (peripheral): Secondary | ICD-10-CM | POA: Diagnosis not present

## 2017-01-14 DIAGNOSIS — B351 Tinea unguium: Secondary | ICD-10-CM | POA: Diagnosis not present

## 2017-01-14 DIAGNOSIS — E1351 Other specified diabetes mellitus with diabetic peripheral angiopathy without gangrene: Secondary | ICD-10-CM | POA: Diagnosis not present

## 2017-01-14 DIAGNOSIS — M79672 Pain in left foot: Secondary | ICD-10-CM | POA: Diagnosis not present

## 2017-01-14 DIAGNOSIS — L602 Onychogryphosis: Secondary | ICD-10-CM | POA: Diagnosis not present

## 2017-01-19 DIAGNOSIS — E1159 Type 2 diabetes mellitus with other circulatory complications: Secondary | ICD-10-CM | POA: Diagnosis not present

## 2017-01-19 DIAGNOSIS — Z6821 Body mass index (BMI) 21.0-21.9, adult: Secondary | ICD-10-CM | POA: Diagnosis not present

## 2017-01-19 DIAGNOSIS — Z794 Long term (current) use of insulin: Secondary | ICD-10-CM | POA: Diagnosis not present

## 2017-01-19 DIAGNOSIS — S80819D Abrasion, unspecified lower leg, subsequent encounter: Secondary | ICD-10-CM | POA: Diagnosis not present

## 2017-01-19 DIAGNOSIS — E46 Unspecified protein-calorie malnutrition: Secondary | ICD-10-CM | POA: Diagnosis not present

## 2017-02-03 DIAGNOSIS — E0842 Diabetes mellitus due to underlying condition with diabetic polyneuropathy: Secondary | ICD-10-CM | POA: Diagnosis not present

## 2017-02-24 DIAGNOSIS — Z6822 Body mass index (BMI) 22.0-22.9, adult: Secondary | ICD-10-CM | POA: Diagnosis not present

## 2017-02-24 DIAGNOSIS — Z794 Long term (current) use of insulin: Secondary | ICD-10-CM | POA: Diagnosis not present

## 2017-02-24 DIAGNOSIS — N182 Chronic kidney disease, stage 2 (mild): Secondary | ICD-10-CM | POA: Diagnosis not present

## 2017-02-24 DIAGNOSIS — E1159 Type 2 diabetes mellitus with other circulatory complications: Secondary | ICD-10-CM | POA: Diagnosis not present

## 2017-02-24 DIAGNOSIS — Z23 Encounter for immunization: Secondary | ICD-10-CM | POA: Diagnosis not present

## 2017-02-24 DIAGNOSIS — I10 Essential (primary) hypertension: Secondary | ICD-10-CM | POA: Diagnosis not present

## 2017-03-16 DIAGNOSIS — E1159 Type 2 diabetes mellitus with other circulatory complications: Secondary | ICD-10-CM | POA: Diagnosis not present

## 2017-03-24 DIAGNOSIS — E7800 Pure hypercholesterolemia, unspecified: Secondary | ICD-10-CM | POA: Diagnosis not present

## 2017-03-24 DIAGNOSIS — Z125 Encounter for screening for malignant neoplasm of prostate: Secondary | ICD-10-CM | POA: Diagnosis not present

## 2017-03-24 DIAGNOSIS — E038 Other specified hypothyroidism: Secondary | ICD-10-CM | POA: Diagnosis not present

## 2017-03-24 DIAGNOSIS — E1149 Type 2 diabetes mellitus with other diabetic neurological complication: Secondary | ICD-10-CM | POA: Diagnosis not present

## 2017-03-29 DIAGNOSIS — Z8546 Personal history of malignant neoplasm of prostate: Secondary | ICD-10-CM | POA: Diagnosis not present

## 2017-03-31 DIAGNOSIS — Z1389 Encounter for screening for other disorder: Secondary | ICD-10-CM | POA: Diagnosis not present

## 2017-03-31 DIAGNOSIS — R808 Other proteinuria: Secondary | ICD-10-CM | POA: Diagnosis not present

## 2017-03-31 DIAGNOSIS — F5104 Psychophysiologic insomnia: Secondary | ICD-10-CM | POA: Diagnosis not present

## 2017-03-31 DIAGNOSIS — C61 Malignant neoplasm of prostate: Secondary | ICD-10-CM | POA: Diagnosis not present

## 2017-03-31 DIAGNOSIS — I129 Hypertensive chronic kidney disease with stage 1 through stage 4 chronic kidney disease, or unspecified chronic kidney disease: Secondary | ICD-10-CM | POA: Diagnosis not present

## 2017-03-31 DIAGNOSIS — Z794 Long term (current) use of insulin: Secondary | ICD-10-CM | POA: Diagnosis not present

## 2017-03-31 DIAGNOSIS — N182 Chronic kidney disease, stage 2 (mild): Secondary | ICD-10-CM | POA: Diagnosis not present

## 2017-03-31 DIAGNOSIS — E038 Other specified hypothyroidism: Secondary | ICD-10-CM | POA: Diagnosis not present

## 2017-03-31 DIAGNOSIS — E1149 Type 2 diabetes mellitus with other diabetic neurological complication: Secondary | ICD-10-CM | POA: Diagnosis not present

## 2017-03-31 DIAGNOSIS — E11319 Type 2 diabetes mellitus with unspecified diabetic retinopathy without macular edema: Secondary | ICD-10-CM | POA: Diagnosis not present

## 2017-03-31 DIAGNOSIS — Z0000 Encounter for general adult medical examination without abnormal findings: Secondary | ICD-10-CM | POA: Diagnosis not present

## 2017-03-31 DIAGNOSIS — R82998 Other abnormal findings in urine: Secondary | ICD-10-CM | POA: Diagnosis not present

## 2017-03-31 DIAGNOSIS — Z6822 Body mass index (BMI) 22.0-22.9, adult: Secondary | ICD-10-CM | POA: Diagnosis not present

## 2017-04-05 DIAGNOSIS — C61 Malignant neoplasm of prostate: Secondary | ICD-10-CM | POA: Diagnosis not present

## 2017-04-08 DIAGNOSIS — N182 Chronic kidney disease, stage 2 (mild): Secondary | ICD-10-CM | POA: Diagnosis not present

## 2017-04-08 DIAGNOSIS — Z794 Long term (current) use of insulin: Secondary | ICD-10-CM | POA: Diagnosis not present

## 2017-04-08 DIAGNOSIS — E1159 Type 2 diabetes mellitus with other circulatory complications: Secondary | ICD-10-CM | POA: Diagnosis not present

## 2017-04-08 DIAGNOSIS — I10 Essential (primary) hypertension: Secondary | ICD-10-CM | POA: Diagnosis not present

## 2017-04-08 DIAGNOSIS — Z6822 Body mass index (BMI) 22.0-22.9, adult: Secondary | ICD-10-CM | POA: Diagnosis not present

## 2017-04-15 DIAGNOSIS — I7389 Other specified peripheral vascular diseases: Secondary | ICD-10-CM | POA: Diagnosis not present

## 2017-04-15 DIAGNOSIS — R600 Localized edema: Secondary | ICD-10-CM | POA: Diagnosis not present

## 2017-04-20 DIAGNOSIS — L602 Onychogryphosis: Secondary | ICD-10-CM | POA: Diagnosis not present

## 2017-04-20 DIAGNOSIS — I739 Peripheral vascular disease, unspecified: Secondary | ICD-10-CM | POA: Diagnosis not present

## 2017-05-05 DIAGNOSIS — E0842 Diabetes mellitus due to underlying condition with diabetic polyneuropathy: Secondary | ICD-10-CM | POA: Diagnosis not present

## 2017-05-07 DIAGNOSIS — K591 Functional diarrhea: Secondary | ICD-10-CM | POA: Diagnosis not present

## 2017-05-07 DIAGNOSIS — R159 Full incontinence of feces: Secondary | ICD-10-CM | POA: Diagnosis not present

## 2017-05-07 DIAGNOSIS — R195 Other fecal abnormalities: Secondary | ICD-10-CM | POA: Diagnosis not present

## 2017-06-09 DIAGNOSIS — E0842 Diabetes mellitus due to underlying condition with diabetic polyneuropathy: Secondary | ICD-10-CM | POA: Diagnosis not present

## 2017-06-30 DIAGNOSIS — E11319 Type 2 diabetes mellitus with unspecified diabetic retinopathy without macular edema: Secondary | ICD-10-CM | POA: Diagnosis not present

## 2017-06-30 DIAGNOSIS — Z6821 Body mass index (BMI) 21.0-21.9, adult: Secondary | ICD-10-CM | POA: Diagnosis not present

## 2017-06-30 DIAGNOSIS — I7389 Other specified peripheral vascular diseases: Secondary | ICD-10-CM | POA: Diagnosis not present

## 2017-06-30 DIAGNOSIS — E46 Unspecified protein-calorie malnutrition: Secondary | ICD-10-CM | POA: Diagnosis not present

## 2017-06-30 DIAGNOSIS — R600 Localized edema: Secondary | ICD-10-CM | POA: Diagnosis not present

## 2017-06-30 DIAGNOSIS — E038 Other specified hypothyroidism: Secondary | ICD-10-CM | POA: Diagnosis not present

## 2017-06-30 DIAGNOSIS — Z794 Long term (current) use of insulin: Secondary | ICD-10-CM | POA: Diagnosis not present

## 2017-06-30 DIAGNOSIS — I10 Essential (primary) hypertension: Secondary | ICD-10-CM | POA: Diagnosis not present

## 2017-06-30 DIAGNOSIS — D508 Other iron deficiency anemias: Secondary | ICD-10-CM | POA: Diagnosis not present

## 2017-06-30 DIAGNOSIS — I129 Hypertensive chronic kidney disease with stage 1 through stage 4 chronic kidney disease, or unspecified chronic kidney disease: Secondary | ICD-10-CM | POA: Diagnosis not present

## 2017-06-30 DIAGNOSIS — E7800 Pure hypercholesterolemia, unspecified: Secondary | ICD-10-CM | POA: Diagnosis not present

## 2017-06-30 DIAGNOSIS — E1149 Type 2 diabetes mellitus with other diabetic neurological complication: Secondary | ICD-10-CM | POA: Diagnosis not present

## 2017-07-05 DIAGNOSIS — R9721 Rising PSA following treatment for malignant neoplasm of prostate: Secondary | ICD-10-CM | POA: Diagnosis not present

## 2017-07-08 DIAGNOSIS — E119 Type 2 diabetes mellitus without complications: Secondary | ICD-10-CM | POA: Diagnosis not present

## 2017-07-08 DIAGNOSIS — H524 Presbyopia: Secondary | ICD-10-CM | POA: Diagnosis not present

## 2017-07-08 DIAGNOSIS — Z961 Presence of intraocular lens: Secondary | ICD-10-CM | POA: Diagnosis not present

## 2017-07-14 DIAGNOSIS — E0842 Diabetes mellitus due to underlying condition with diabetic polyneuropathy: Secondary | ICD-10-CM | POA: Diagnosis not present

## 2017-07-22 DIAGNOSIS — C61 Malignant neoplasm of prostate: Secondary | ICD-10-CM | POA: Diagnosis not present

## 2017-09-30 DIAGNOSIS — C61 Malignant neoplasm of prostate: Secondary | ICD-10-CM | POA: Diagnosis not present

## 2017-09-30 DIAGNOSIS — Z794 Long term (current) use of insulin: Secondary | ICD-10-CM | POA: Diagnosis not present

## 2017-09-30 DIAGNOSIS — E038 Other specified hypothyroidism: Secondary | ICD-10-CM | POA: Diagnosis not present

## 2017-09-30 DIAGNOSIS — N182 Chronic kidney disease, stage 2 (mild): Secondary | ICD-10-CM | POA: Diagnosis not present

## 2017-09-30 DIAGNOSIS — E46 Unspecified protein-calorie malnutrition: Secondary | ICD-10-CM | POA: Diagnosis not present

## 2017-09-30 DIAGNOSIS — E7800 Pure hypercholesterolemia, unspecified: Secondary | ICD-10-CM | POA: Diagnosis not present

## 2017-09-30 DIAGNOSIS — R808 Other proteinuria: Secondary | ICD-10-CM | POA: Diagnosis not present

## 2017-09-30 DIAGNOSIS — I7389 Other specified peripheral vascular diseases: Secondary | ICD-10-CM | POA: Diagnosis not present

## 2017-09-30 DIAGNOSIS — Z6821 Body mass index (BMI) 21.0-21.9, adult: Secondary | ICD-10-CM | POA: Diagnosis not present

## 2017-09-30 DIAGNOSIS — E1159 Type 2 diabetes mellitus with other circulatory complications: Secondary | ICD-10-CM | POA: Diagnosis not present

## 2017-09-30 DIAGNOSIS — I129 Hypertensive chronic kidney disease with stage 1 through stage 4 chronic kidney disease, or unspecified chronic kidney disease: Secondary | ICD-10-CM | POA: Diagnosis not present

## 2017-09-30 DIAGNOSIS — R600 Localized edema: Secondary | ICD-10-CM | POA: Diagnosis not present

## 2017-10-11 DIAGNOSIS — Z8546 Personal history of malignant neoplasm of prostate: Secondary | ICD-10-CM | POA: Diagnosis not present

## 2017-10-18 DIAGNOSIS — Z192 Hormone resistant malignancy status: Secondary | ICD-10-CM | POA: Diagnosis not present

## 2017-10-18 DIAGNOSIS — C61 Malignant neoplasm of prostate: Secondary | ICD-10-CM | POA: Diagnosis not present

## 2017-10-19 DIAGNOSIS — D0462 Carcinoma in situ of skin of left upper limb, including shoulder: Secondary | ICD-10-CM | POA: Diagnosis not present

## 2017-10-19 DIAGNOSIS — L570 Actinic keratosis: Secondary | ICD-10-CM | POA: Diagnosis not present

## 2017-10-19 DIAGNOSIS — X32XXXA Exposure to sunlight, initial encounter: Secondary | ICD-10-CM | POA: Diagnosis not present

## 2017-12-31 DIAGNOSIS — N182 Chronic kidney disease, stage 2 (mild): Secondary | ICD-10-CM | POA: Diagnosis not present

## 2017-12-31 DIAGNOSIS — Z6821 Body mass index (BMI) 21.0-21.9, adult: Secondary | ICD-10-CM | POA: Diagnosis not present

## 2017-12-31 DIAGNOSIS — I7389 Other specified peripheral vascular diseases: Secondary | ICD-10-CM | POA: Diagnosis not present

## 2017-12-31 DIAGNOSIS — C61 Malignant neoplasm of prostate: Secondary | ICD-10-CM | POA: Diagnosis not present

## 2017-12-31 DIAGNOSIS — I10 Essential (primary) hypertension: Secondary | ICD-10-CM | POA: Diagnosis not present

## 2017-12-31 DIAGNOSIS — Z794 Long term (current) use of insulin: Secondary | ICD-10-CM | POA: Diagnosis not present

## 2017-12-31 DIAGNOSIS — E1149 Type 2 diabetes mellitus with other diabetic neurological complication: Secondary | ICD-10-CM | POA: Diagnosis not present

## 2017-12-31 DIAGNOSIS — E7800 Pure hypercholesterolemia, unspecified: Secondary | ICD-10-CM | POA: Diagnosis not present

## 2017-12-31 DIAGNOSIS — E11319 Type 2 diabetes mellitus with unspecified diabetic retinopathy without macular edema: Secondary | ICD-10-CM | POA: Diagnosis not present

## 2017-12-31 DIAGNOSIS — E1151 Type 2 diabetes mellitus with diabetic peripheral angiopathy without gangrene: Secondary | ICD-10-CM | POA: Diagnosis not present

## 2017-12-31 DIAGNOSIS — E038 Other specified hypothyroidism: Secondary | ICD-10-CM | POA: Diagnosis not present

## 2018-01-18 DIAGNOSIS — Z8546 Personal history of malignant neoplasm of prostate: Secondary | ICD-10-CM | POA: Diagnosis not present

## 2018-02-11 DIAGNOSIS — R197 Diarrhea, unspecified: Secondary | ICD-10-CM | POA: Diagnosis not present

## 2018-02-11 DIAGNOSIS — R040 Epistaxis: Secondary | ICD-10-CM | POA: Diagnosis not present

## 2018-02-11 DIAGNOSIS — C61 Malignant neoplasm of prostate: Secondary | ICD-10-CM | POA: Diagnosis not present

## 2018-02-11 DIAGNOSIS — Z681 Body mass index (BMI) 19 or less, adult: Secondary | ICD-10-CM | POA: Diagnosis not present

## 2018-02-11 DIAGNOSIS — E1149 Type 2 diabetes mellitus with other diabetic neurological complication: Secondary | ICD-10-CM | POA: Diagnosis not present

## 2018-03-30 DIAGNOSIS — I10 Essential (primary) hypertension: Secondary | ICD-10-CM | POA: Diagnosis not present

## 2018-03-30 DIAGNOSIS — E7800 Pure hypercholesterolemia, unspecified: Secondary | ICD-10-CM | POA: Diagnosis not present

## 2018-03-30 DIAGNOSIS — E1151 Type 2 diabetes mellitus with diabetic peripheral angiopathy without gangrene: Secondary | ICD-10-CM | POA: Diagnosis not present

## 2018-03-30 DIAGNOSIS — R82998 Other abnormal findings in urine: Secondary | ICD-10-CM | POA: Diagnosis not present

## 2018-03-30 DIAGNOSIS — E038 Other specified hypothyroidism: Secondary | ICD-10-CM | POA: Diagnosis not present

## 2018-03-30 DIAGNOSIS — Z125 Encounter for screening for malignant neoplasm of prostate: Secondary | ICD-10-CM | POA: Diagnosis not present

## 2018-04-06 DIAGNOSIS — I129 Hypertensive chronic kidney disease with stage 1 through stage 4 chronic kidney disease, or unspecified chronic kidney disease: Secondary | ICD-10-CM | POA: Diagnosis not present

## 2018-04-06 DIAGNOSIS — E7800 Pure hypercholesterolemia, unspecified: Secondary | ICD-10-CM | POA: Diagnosis not present

## 2018-04-06 DIAGNOSIS — I7389 Other specified peripheral vascular diseases: Secondary | ICD-10-CM | POA: Diagnosis not present

## 2018-04-06 DIAGNOSIS — E1151 Type 2 diabetes mellitus with diabetic peripheral angiopathy without gangrene: Secondary | ICD-10-CM | POA: Diagnosis not present

## 2018-04-06 DIAGNOSIS — Z0000 Encounter for general adult medical examination without abnormal findings: Secondary | ICD-10-CM | POA: Diagnosis not present

## 2018-04-06 DIAGNOSIS — Z6820 Body mass index (BMI) 20.0-20.9, adult: Secondary | ICD-10-CM | POA: Diagnosis not present

## 2018-04-06 DIAGNOSIS — E038 Other specified hypothyroidism: Secondary | ICD-10-CM | POA: Diagnosis not present

## 2018-04-06 DIAGNOSIS — I10 Essential (primary) hypertension: Secondary | ICD-10-CM | POA: Diagnosis not present

## 2018-04-06 DIAGNOSIS — Z23 Encounter for immunization: Secondary | ICD-10-CM | POA: Diagnosis not present

## 2018-04-06 DIAGNOSIS — C61 Malignant neoplasm of prostate: Secondary | ICD-10-CM | POA: Diagnosis not present

## 2018-04-06 DIAGNOSIS — N183 Chronic kidney disease, stage 3 (moderate): Secondary | ICD-10-CM | POA: Diagnosis not present

## 2018-04-06 DIAGNOSIS — Z1389 Encounter for screening for other disorder: Secondary | ICD-10-CM | POA: Diagnosis not present

## 2018-04-06 DIAGNOSIS — E1149 Type 2 diabetes mellitus with other diabetic neurological complication: Secondary | ICD-10-CM | POA: Diagnosis not present

## 2018-04-11 DIAGNOSIS — Z8546 Personal history of malignant neoplasm of prostate: Secondary | ICD-10-CM | POA: Diagnosis not present

## 2018-04-13 DIAGNOSIS — Z1212 Encounter for screening for malignant neoplasm of rectum: Secondary | ICD-10-CM | POA: Diagnosis not present

## 2018-04-18 DIAGNOSIS — C61 Malignant neoplasm of prostate: Secondary | ICD-10-CM | POA: Diagnosis not present

## 2018-10-05 DIAGNOSIS — I739 Peripheral vascular disease, unspecified: Secondary | ICD-10-CM | POA: Diagnosis not present

## 2018-10-05 DIAGNOSIS — Z794 Long term (current) use of insulin: Secondary | ICD-10-CM | POA: Diagnosis not present

## 2018-10-05 DIAGNOSIS — E039 Hypothyroidism, unspecified: Secondary | ICD-10-CM | POA: Diagnosis not present

## 2018-10-05 DIAGNOSIS — E46 Unspecified protein-calorie malnutrition: Secondary | ICD-10-CM | POA: Diagnosis not present

## 2018-10-05 DIAGNOSIS — E11319 Type 2 diabetes mellitus with unspecified diabetic retinopathy without macular edema: Secondary | ICD-10-CM | POA: Diagnosis not present

## 2018-10-05 DIAGNOSIS — R809 Proteinuria, unspecified: Secondary | ICD-10-CM | POA: Diagnosis not present

## 2018-10-05 DIAGNOSIS — N182 Chronic kidney disease, stage 2 (mild): Secondary | ICD-10-CM | POA: Diagnosis not present

## 2018-10-05 DIAGNOSIS — E1151 Type 2 diabetes mellitus with diabetic peripheral angiopathy without gangrene: Secondary | ICD-10-CM | POA: Diagnosis not present

## 2018-10-05 DIAGNOSIS — E7800 Pure hypercholesterolemia, unspecified: Secondary | ICD-10-CM | POA: Diagnosis not present

## 2018-10-05 DIAGNOSIS — I129 Hypertensive chronic kidney disease with stage 1 through stage 4 chronic kidney disease, or unspecified chronic kidney disease: Secondary | ICD-10-CM | POA: Diagnosis not present

## 2018-10-05 DIAGNOSIS — E1149 Type 2 diabetes mellitus with other diabetic neurological complication: Secondary | ICD-10-CM | POA: Diagnosis not present

## 2018-10-05 DIAGNOSIS — C61 Malignant neoplasm of prostate: Secondary | ICD-10-CM | POA: Diagnosis not present

## 2018-10-21 DIAGNOSIS — C61 Malignant neoplasm of prostate: Secondary | ICD-10-CM | POA: Diagnosis not present

## 2018-10-28 DIAGNOSIS — C7951 Secondary malignant neoplasm of bone: Secondary | ICD-10-CM | POA: Diagnosis not present

## 2018-10-28 DIAGNOSIS — Z5111 Encounter for antineoplastic chemotherapy: Secondary | ICD-10-CM | POA: Diagnosis not present

## 2018-10-28 DIAGNOSIS — C61 Malignant neoplasm of prostate: Secondary | ICD-10-CM | POA: Diagnosis not present

## 2019-01-26 DIAGNOSIS — Z961 Presence of intraocular lens: Secondary | ICD-10-CM | POA: Diagnosis not present

## 2019-01-26 DIAGNOSIS — E119 Type 2 diabetes mellitus without complications: Secondary | ICD-10-CM | POA: Diagnosis not present

## 2019-01-26 DIAGNOSIS — H524 Presbyopia: Secondary | ICD-10-CM | POA: Diagnosis not present

## 2019-03-27 DIAGNOSIS — E11622 Type 2 diabetes mellitus with other skin ulcer: Secondary | ICD-10-CM | POA: Diagnosis not present

## 2019-03-27 DIAGNOSIS — E1151 Type 2 diabetes mellitus with diabetic peripheral angiopathy without gangrene: Secondary | ICD-10-CM | POA: Diagnosis not present

## 2019-03-27 DIAGNOSIS — I739 Peripheral vascular disease, unspecified: Secondary | ICD-10-CM | POA: Diagnosis not present

## 2019-03-27 DIAGNOSIS — R600 Localized edema: Secondary | ICD-10-CM | POA: Diagnosis not present

## 2019-03-28 ENCOUNTER — Ambulatory Visit (HOSPITAL_COMMUNITY)
Admission: RE | Admit: 2019-03-28 | Discharge: 2019-03-28 | Disposition: A | Discharge status: Home / Self Care | Payer: Medicare Other | Source: Ambulatory Visit | Attending: Family | Admitting: Family

## 2019-03-28 ENCOUNTER — Other Ambulatory Visit (HOSPITAL_COMMUNITY): Payer: Self-pay | Admitting: Internal Medicine

## 2019-03-28 ENCOUNTER — Other Ambulatory Visit: Payer: Self-pay

## 2019-03-28 DIAGNOSIS — R600 Localized edema: Secondary | ICD-10-CM | POA: Insufficient documentation

## 2019-03-28 DIAGNOSIS — T148XXA Other injury of unspecified body region, initial encounter: Secondary | ICD-10-CM | POA: Diagnosis not present

## 2019-03-28 DIAGNOSIS — L853 Xerosis cutis: Secondary | ICD-10-CM | POA: Diagnosis not present

## 2019-03-28 DIAGNOSIS — I739 Peripheral vascular disease, unspecified: Secondary | ICD-10-CM | POA: Diagnosis not present

## 2019-03-28 DIAGNOSIS — E1151 Type 2 diabetes mellitus with diabetic peripheral angiopathy without gangrene: Secondary | ICD-10-CM | POA: Diagnosis not present

## 2019-03-28 DIAGNOSIS — E11622 Type 2 diabetes mellitus with other skin ulcer: Secondary | ICD-10-CM | POA: Diagnosis not present

## 2019-03-30 DIAGNOSIS — E11622 Type 2 diabetes mellitus with other skin ulcer: Secondary | ICD-10-CM | POA: Diagnosis not present

## 2019-03-30 DIAGNOSIS — I129 Hypertensive chronic kidney disease with stage 1 through stage 4 chronic kidney disease, or unspecified chronic kidney disease: Secondary | ICD-10-CM | POA: Diagnosis not present

## 2019-03-30 DIAGNOSIS — E1151 Type 2 diabetes mellitus with diabetic peripheral angiopathy without gangrene: Secondary | ICD-10-CM | POA: Diagnosis not present

## 2019-03-30 DIAGNOSIS — Z7982 Long term (current) use of aspirin: Secondary | ICD-10-CM | POA: Diagnosis not present

## 2019-03-30 DIAGNOSIS — E46 Unspecified protein-calorie malnutrition: Secondary | ICD-10-CM | POA: Diagnosis not present

## 2019-03-30 DIAGNOSIS — N182 Chronic kidney disease, stage 2 (mild): Secondary | ICD-10-CM | POA: Diagnosis not present

## 2019-03-30 DIAGNOSIS — E039 Hypothyroidism, unspecified: Secondary | ICD-10-CM | POA: Diagnosis not present

## 2019-03-30 DIAGNOSIS — L97821 Non-pressure chronic ulcer of other part of left lower leg limited to breakdown of skin: Secondary | ICD-10-CM | POA: Diagnosis not present

## 2019-03-30 DIAGNOSIS — Z794 Long term (current) use of insulin: Secondary | ICD-10-CM | POA: Diagnosis not present

## 2019-03-30 DIAGNOSIS — C61 Malignant neoplasm of prostate: Secondary | ICD-10-CM | POA: Diagnosis not present

## 2019-03-30 DIAGNOSIS — E1122 Type 2 diabetes mellitus with diabetic chronic kidney disease: Secondary | ICD-10-CM | POA: Diagnosis not present

## 2019-03-30 DIAGNOSIS — D631 Anemia in chronic kidney disease: Secondary | ICD-10-CM | POA: Diagnosis not present

## 2019-03-30 DIAGNOSIS — Z4800 Encounter for change or removal of nonsurgical wound dressing: Secondary | ICD-10-CM | POA: Diagnosis not present

## 2019-03-30 DIAGNOSIS — F419 Anxiety disorder, unspecified: Secondary | ICD-10-CM | POA: Diagnosis not present

## 2019-03-30 DIAGNOSIS — E11319 Type 2 diabetes mellitus with unspecified diabetic retinopathy without macular edema: Secondary | ICD-10-CM | POA: Diagnosis not present

## 2019-04-03 DIAGNOSIS — I129 Hypertensive chronic kidney disease with stage 1 through stage 4 chronic kidney disease, or unspecified chronic kidney disease: Secondary | ICD-10-CM | POA: Diagnosis not present

## 2019-04-03 DIAGNOSIS — L97821 Non-pressure chronic ulcer of other part of left lower leg limited to breakdown of skin: Secondary | ICD-10-CM | POA: Diagnosis not present

## 2019-04-03 DIAGNOSIS — E11622 Type 2 diabetes mellitus with other skin ulcer: Secondary | ICD-10-CM | POA: Diagnosis not present

## 2019-04-03 DIAGNOSIS — E1151 Type 2 diabetes mellitus with diabetic peripheral angiopathy without gangrene: Secondary | ICD-10-CM | POA: Diagnosis not present

## 2019-04-03 DIAGNOSIS — N182 Chronic kidney disease, stage 2 (mild): Secondary | ICD-10-CM | POA: Diagnosis not present

## 2019-04-03 DIAGNOSIS — E1122 Type 2 diabetes mellitus with diabetic chronic kidney disease: Secondary | ICD-10-CM | POA: Diagnosis not present

## 2019-04-04 DIAGNOSIS — E7800 Pure hypercholesterolemia, unspecified: Secondary | ICD-10-CM | POA: Diagnosis not present

## 2019-04-04 DIAGNOSIS — Z23 Encounter for immunization: Secondary | ICD-10-CM | POA: Diagnosis not present

## 2019-04-04 DIAGNOSIS — E1151 Type 2 diabetes mellitus with diabetic peripheral angiopathy without gangrene: Secondary | ICD-10-CM | POA: Diagnosis not present

## 2019-04-04 DIAGNOSIS — Z125 Encounter for screening for malignant neoplasm of prostate: Secondary | ICD-10-CM | POA: Diagnosis not present

## 2019-04-04 DIAGNOSIS — E038 Other specified hypothyroidism: Secondary | ICD-10-CM | POA: Diagnosis not present

## 2019-04-06 DIAGNOSIS — E1122 Type 2 diabetes mellitus with diabetic chronic kidney disease: Secondary | ICD-10-CM | POA: Diagnosis not present

## 2019-04-06 DIAGNOSIS — I129 Hypertensive chronic kidney disease with stage 1 through stage 4 chronic kidney disease, or unspecified chronic kidney disease: Secondary | ICD-10-CM | POA: Diagnosis not present

## 2019-04-06 DIAGNOSIS — L97821 Non-pressure chronic ulcer of other part of left lower leg limited to breakdown of skin: Secondary | ICD-10-CM | POA: Diagnosis not present

## 2019-04-06 DIAGNOSIS — N182 Chronic kidney disease, stage 2 (mild): Secondary | ICD-10-CM | POA: Diagnosis not present

## 2019-04-06 DIAGNOSIS — E11622 Type 2 diabetes mellitus with other skin ulcer: Secondary | ICD-10-CM | POA: Diagnosis not present

## 2019-04-06 DIAGNOSIS — E1151 Type 2 diabetes mellitus with diabetic peripheral angiopathy without gangrene: Secondary | ICD-10-CM | POA: Diagnosis not present

## 2019-04-10 DIAGNOSIS — E1151 Type 2 diabetes mellitus with diabetic peripheral angiopathy without gangrene: Secondary | ICD-10-CM | POA: Diagnosis not present

## 2019-04-10 DIAGNOSIS — N182 Chronic kidney disease, stage 2 (mild): Secondary | ICD-10-CM | POA: Diagnosis not present

## 2019-04-10 DIAGNOSIS — I129 Hypertensive chronic kidney disease with stage 1 through stage 4 chronic kidney disease, or unspecified chronic kidney disease: Secondary | ICD-10-CM | POA: Diagnosis not present

## 2019-04-10 DIAGNOSIS — L97821 Non-pressure chronic ulcer of other part of left lower leg limited to breakdown of skin: Secondary | ICD-10-CM | POA: Diagnosis not present

## 2019-04-10 DIAGNOSIS — E1122 Type 2 diabetes mellitus with diabetic chronic kidney disease: Secondary | ICD-10-CM | POA: Diagnosis not present

## 2019-04-10 DIAGNOSIS — E11622 Type 2 diabetes mellitus with other skin ulcer: Secondary | ICD-10-CM | POA: Diagnosis not present

## 2019-04-11 DIAGNOSIS — R600 Localized edema: Secondary | ICD-10-CM | POA: Diagnosis not present

## 2019-04-11 DIAGNOSIS — E11319 Type 2 diabetes mellitus with unspecified diabetic retinopathy without macular edema: Secondary | ICD-10-CM | POA: Diagnosis not present

## 2019-04-11 DIAGNOSIS — E11622 Type 2 diabetes mellitus with other skin ulcer: Secondary | ICD-10-CM | POA: Diagnosis not present

## 2019-04-11 DIAGNOSIS — E1149 Type 2 diabetes mellitus with other diabetic neurological complication: Secondary | ICD-10-CM | POA: Diagnosis not present

## 2019-04-11 DIAGNOSIS — E46 Unspecified protein-calorie malnutrition: Secondary | ICD-10-CM | POA: Diagnosis not present

## 2019-04-11 DIAGNOSIS — I129 Hypertensive chronic kidney disease with stage 1 through stage 4 chronic kidney disease, or unspecified chronic kidney disease: Secondary | ICD-10-CM | POA: Diagnosis not present

## 2019-04-11 DIAGNOSIS — Z1339 Encounter for screening examination for other mental health and behavioral disorders: Secondary | ICD-10-CM | POA: Diagnosis not present

## 2019-04-11 DIAGNOSIS — N182 Chronic kidney disease, stage 2 (mild): Secondary | ICD-10-CM | POA: Diagnosis not present

## 2019-04-11 DIAGNOSIS — Z794 Long term (current) use of insulin: Secondary | ICD-10-CM | POA: Diagnosis not present

## 2019-04-11 DIAGNOSIS — Z0000 Encounter for general adult medical examination without abnormal findings: Secondary | ICD-10-CM | POA: Diagnosis not present

## 2019-04-11 DIAGNOSIS — I739 Peripheral vascular disease, unspecified: Secondary | ICD-10-CM | POA: Diagnosis not present

## 2019-04-11 DIAGNOSIS — E1151 Type 2 diabetes mellitus with diabetic peripheral angiopathy without gangrene: Secondary | ICD-10-CM | POA: Diagnosis not present

## 2019-04-11 DIAGNOSIS — C61 Malignant neoplasm of prostate: Secondary | ICD-10-CM | POA: Diagnosis not present

## 2019-04-13 DIAGNOSIS — E11622 Type 2 diabetes mellitus with other skin ulcer: Secondary | ICD-10-CM | POA: Diagnosis not present

## 2019-04-13 DIAGNOSIS — L97821 Non-pressure chronic ulcer of other part of left lower leg limited to breakdown of skin: Secondary | ICD-10-CM | POA: Diagnosis not present

## 2019-04-13 DIAGNOSIS — I129 Hypertensive chronic kidney disease with stage 1 through stage 4 chronic kidney disease, or unspecified chronic kidney disease: Secondary | ICD-10-CM | POA: Diagnosis not present

## 2019-04-13 DIAGNOSIS — N182 Chronic kidney disease, stage 2 (mild): Secondary | ICD-10-CM | POA: Diagnosis not present

## 2019-04-13 DIAGNOSIS — E1151 Type 2 diabetes mellitus with diabetic peripheral angiopathy without gangrene: Secondary | ICD-10-CM | POA: Diagnosis not present

## 2019-04-13 DIAGNOSIS — E1122 Type 2 diabetes mellitus with diabetic chronic kidney disease: Secondary | ICD-10-CM | POA: Diagnosis not present

## 2019-04-17 DIAGNOSIS — E1122 Type 2 diabetes mellitus with diabetic chronic kidney disease: Secondary | ICD-10-CM | POA: Diagnosis not present

## 2019-04-17 DIAGNOSIS — E11622 Type 2 diabetes mellitus with other skin ulcer: Secondary | ICD-10-CM | POA: Diagnosis not present

## 2019-04-17 DIAGNOSIS — I129 Hypertensive chronic kidney disease with stage 1 through stage 4 chronic kidney disease, or unspecified chronic kidney disease: Secondary | ICD-10-CM | POA: Diagnosis not present

## 2019-04-17 DIAGNOSIS — E1151 Type 2 diabetes mellitus with diabetic peripheral angiopathy without gangrene: Secondary | ICD-10-CM | POA: Diagnosis not present

## 2019-04-17 DIAGNOSIS — L97821 Non-pressure chronic ulcer of other part of left lower leg limited to breakdown of skin: Secondary | ICD-10-CM | POA: Diagnosis not present

## 2019-04-17 DIAGNOSIS — N182 Chronic kidney disease, stage 2 (mild): Secondary | ICD-10-CM | POA: Diagnosis not present

## 2019-04-20 DIAGNOSIS — I129 Hypertensive chronic kidney disease with stage 1 through stage 4 chronic kidney disease, or unspecified chronic kidney disease: Secondary | ICD-10-CM | POA: Diagnosis not present

## 2019-04-20 DIAGNOSIS — N182 Chronic kidney disease, stage 2 (mild): Secondary | ICD-10-CM | POA: Diagnosis not present

## 2019-04-20 DIAGNOSIS — E1151 Type 2 diabetes mellitus with diabetic peripheral angiopathy without gangrene: Secondary | ICD-10-CM | POA: Diagnosis not present

## 2019-04-20 DIAGNOSIS — E11622 Type 2 diabetes mellitus with other skin ulcer: Secondary | ICD-10-CM | POA: Diagnosis not present

## 2019-04-20 DIAGNOSIS — E1122 Type 2 diabetes mellitus with diabetic chronic kidney disease: Secondary | ICD-10-CM | POA: Diagnosis not present

## 2019-04-20 DIAGNOSIS — L97821 Non-pressure chronic ulcer of other part of left lower leg limited to breakdown of skin: Secondary | ICD-10-CM | POA: Diagnosis not present

## 2019-04-24 DIAGNOSIS — E1122 Type 2 diabetes mellitus with diabetic chronic kidney disease: Secondary | ICD-10-CM | POA: Diagnosis not present

## 2019-04-24 DIAGNOSIS — L97821 Non-pressure chronic ulcer of other part of left lower leg limited to breakdown of skin: Secondary | ICD-10-CM | POA: Diagnosis not present

## 2019-04-24 DIAGNOSIS — E1151 Type 2 diabetes mellitus with diabetic peripheral angiopathy without gangrene: Secondary | ICD-10-CM | POA: Diagnosis not present

## 2019-04-24 DIAGNOSIS — N182 Chronic kidney disease, stage 2 (mild): Secondary | ICD-10-CM | POA: Diagnosis not present

## 2019-04-24 DIAGNOSIS — I129 Hypertensive chronic kidney disease with stage 1 through stage 4 chronic kidney disease, or unspecified chronic kidney disease: Secondary | ICD-10-CM | POA: Diagnosis not present

## 2019-04-24 DIAGNOSIS — E11622 Type 2 diabetes mellitus with other skin ulcer: Secondary | ICD-10-CM | POA: Diagnosis not present

## 2019-04-27 DIAGNOSIS — I129 Hypertensive chronic kidney disease with stage 1 through stage 4 chronic kidney disease, or unspecified chronic kidney disease: Secondary | ICD-10-CM | POA: Diagnosis not present

## 2019-04-27 DIAGNOSIS — L97821 Non-pressure chronic ulcer of other part of left lower leg limited to breakdown of skin: Secondary | ICD-10-CM | POA: Diagnosis not present

## 2019-04-27 DIAGNOSIS — N182 Chronic kidney disease, stage 2 (mild): Secondary | ICD-10-CM | POA: Diagnosis not present

## 2019-04-27 DIAGNOSIS — E1122 Type 2 diabetes mellitus with diabetic chronic kidney disease: Secondary | ICD-10-CM | POA: Diagnosis not present

## 2019-04-27 DIAGNOSIS — E11622 Type 2 diabetes mellitus with other skin ulcer: Secondary | ICD-10-CM | POA: Diagnosis not present

## 2019-04-27 DIAGNOSIS — E1151 Type 2 diabetes mellitus with diabetic peripheral angiopathy without gangrene: Secondary | ICD-10-CM | POA: Diagnosis not present

## 2019-04-29 DIAGNOSIS — E46 Unspecified protein-calorie malnutrition: Secondary | ICD-10-CM | POA: Diagnosis not present

## 2019-04-29 DIAGNOSIS — I129 Hypertensive chronic kidney disease with stage 1 through stage 4 chronic kidney disease, or unspecified chronic kidney disease: Secondary | ICD-10-CM | POA: Diagnosis not present

## 2019-04-29 DIAGNOSIS — L97821 Non-pressure chronic ulcer of other part of left lower leg limited to breakdown of skin: Secondary | ICD-10-CM | POA: Diagnosis not present

## 2019-04-29 DIAGNOSIS — E11319 Type 2 diabetes mellitus with unspecified diabetic retinopathy without macular edema: Secondary | ICD-10-CM | POA: Diagnosis not present

## 2019-04-29 DIAGNOSIS — D631 Anemia in chronic kidney disease: Secondary | ICD-10-CM | POA: Diagnosis not present

## 2019-04-29 DIAGNOSIS — F419 Anxiety disorder, unspecified: Secondary | ICD-10-CM | POA: Diagnosis not present

## 2019-04-29 DIAGNOSIS — E039 Hypothyroidism, unspecified: Secondary | ICD-10-CM | POA: Diagnosis not present

## 2019-04-29 DIAGNOSIS — C61 Malignant neoplasm of prostate: Secondary | ICD-10-CM | POA: Diagnosis not present

## 2019-04-29 DIAGNOSIS — Z4800 Encounter for change or removal of nonsurgical wound dressing: Secondary | ICD-10-CM | POA: Diagnosis not present

## 2019-04-29 DIAGNOSIS — E1122 Type 2 diabetes mellitus with diabetic chronic kidney disease: Secondary | ICD-10-CM | POA: Diagnosis not present

## 2019-04-29 DIAGNOSIS — Z7982 Long term (current) use of aspirin: Secondary | ICD-10-CM | POA: Diagnosis not present

## 2019-04-29 DIAGNOSIS — E1151 Type 2 diabetes mellitus with diabetic peripheral angiopathy without gangrene: Secondary | ICD-10-CM | POA: Diagnosis not present

## 2019-04-29 DIAGNOSIS — N182 Chronic kidney disease, stage 2 (mild): Secondary | ICD-10-CM | POA: Diagnosis not present

## 2019-04-29 DIAGNOSIS — Z794 Long term (current) use of insulin: Secondary | ICD-10-CM | POA: Diagnosis not present

## 2019-04-29 DIAGNOSIS — E11622 Type 2 diabetes mellitus with other skin ulcer: Secondary | ICD-10-CM | POA: Diagnosis not present

## 2019-05-02 DIAGNOSIS — L97821 Non-pressure chronic ulcer of other part of left lower leg limited to breakdown of skin: Secondary | ICD-10-CM | POA: Diagnosis not present

## 2019-05-02 DIAGNOSIS — I129 Hypertensive chronic kidney disease with stage 1 through stage 4 chronic kidney disease, or unspecified chronic kidney disease: Secondary | ICD-10-CM | POA: Diagnosis not present

## 2019-05-02 DIAGNOSIS — E1151 Type 2 diabetes mellitus with diabetic peripheral angiopathy without gangrene: Secondary | ICD-10-CM | POA: Diagnosis not present

## 2019-05-02 DIAGNOSIS — E1122 Type 2 diabetes mellitus with diabetic chronic kidney disease: Secondary | ICD-10-CM | POA: Diagnosis not present

## 2019-05-02 DIAGNOSIS — N182 Chronic kidney disease, stage 2 (mild): Secondary | ICD-10-CM | POA: Diagnosis not present

## 2019-05-02 DIAGNOSIS — E11622 Type 2 diabetes mellitus with other skin ulcer: Secondary | ICD-10-CM | POA: Diagnosis not present

## 2019-05-08 DIAGNOSIS — I129 Hypertensive chronic kidney disease with stage 1 through stage 4 chronic kidney disease, or unspecified chronic kidney disease: Secondary | ICD-10-CM | POA: Diagnosis not present

## 2019-05-08 DIAGNOSIS — L97821 Non-pressure chronic ulcer of other part of left lower leg limited to breakdown of skin: Secondary | ICD-10-CM | POA: Diagnosis not present

## 2019-05-08 DIAGNOSIS — E11622 Type 2 diabetes mellitus with other skin ulcer: Secondary | ICD-10-CM | POA: Diagnosis not present

## 2019-05-08 DIAGNOSIS — E1151 Type 2 diabetes mellitus with diabetic peripheral angiopathy without gangrene: Secondary | ICD-10-CM | POA: Diagnosis not present

## 2019-05-08 DIAGNOSIS — E1122 Type 2 diabetes mellitus with diabetic chronic kidney disease: Secondary | ICD-10-CM | POA: Diagnosis not present

## 2019-05-08 DIAGNOSIS — N182 Chronic kidney disease, stage 2 (mild): Secondary | ICD-10-CM | POA: Diagnosis not present

## 2019-05-09 DIAGNOSIS — E11622 Type 2 diabetes mellitus with other skin ulcer: Secondary | ICD-10-CM | POA: Diagnosis not present

## 2019-05-09 DIAGNOSIS — N182 Chronic kidney disease, stage 2 (mild): Secondary | ICD-10-CM | POA: Diagnosis not present

## 2019-05-09 DIAGNOSIS — L97821 Non-pressure chronic ulcer of other part of left lower leg limited to breakdown of skin: Secondary | ICD-10-CM | POA: Diagnosis not present

## 2019-05-09 DIAGNOSIS — I129 Hypertensive chronic kidney disease with stage 1 through stage 4 chronic kidney disease, or unspecified chronic kidney disease: Secondary | ICD-10-CM | POA: Diagnosis not present

## 2019-05-09 DIAGNOSIS — E1122 Type 2 diabetes mellitus with diabetic chronic kidney disease: Secondary | ICD-10-CM | POA: Diagnosis not present

## 2019-05-09 DIAGNOSIS — E1151 Type 2 diabetes mellitus with diabetic peripheral angiopathy without gangrene: Secondary | ICD-10-CM | POA: Diagnosis not present

## 2019-05-11 DIAGNOSIS — E1151 Type 2 diabetes mellitus with diabetic peripheral angiopathy without gangrene: Secondary | ICD-10-CM | POA: Diagnosis not present

## 2019-05-11 DIAGNOSIS — N182 Chronic kidney disease, stage 2 (mild): Secondary | ICD-10-CM | POA: Diagnosis not present

## 2019-05-11 DIAGNOSIS — I129 Hypertensive chronic kidney disease with stage 1 through stage 4 chronic kidney disease, or unspecified chronic kidney disease: Secondary | ICD-10-CM | POA: Diagnosis not present

## 2019-05-11 DIAGNOSIS — E1122 Type 2 diabetes mellitus with diabetic chronic kidney disease: Secondary | ICD-10-CM | POA: Diagnosis not present

## 2019-05-11 DIAGNOSIS — L97821 Non-pressure chronic ulcer of other part of left lower leg limited to breakdown of skin: Secondary | ICD-10-CM | POA: Diagnosis not present

## 2019-05-11 DIAGNOSIS — E11622 Type 2 diabetes mellitus with other skin ulcer: Secondary | ICD-10-CM | POA: Diagnosis not present

## 2019-05-15 DIAGNOSIS — E11622 Type 2 diabetes mellitus with other skin ulcer: Secondary | ICD-10-CM | POA: Diagnosis not present

## 2019-05-15 DIAGNOSIS — N182 Chronic kidney disease, stage 2 (mild): Secondary | ICD-10-CM | POA: Diagnosis not present

## 2019-05-15 DIAGNOSIS — E1151 Type 2 diabetes mellitus with diabetic peripheral angiopathy without gangrene: Secondary | ICD-10-CM | POA: Diagnosis not present

## 2019-05-15 DIAGNOSIS — I129 Hypertensive chronic kidney disease with stage 1 through stage 4 chronic kidney disease, or unspecified chronic kidney disease: Secondary | ICD-10-CM | POA: Diagnosis not present

## 2019-05-15 DIAGNOSIS — E1122 Type 2 diabetes mellitus with diabetic chronic kidney disease: Secondary | ICD-10-CM | POA: Diagnosis not present

## 2019-05-15 DIAGNOSIS — L97821 Non-pressure chronic ulcer of other part of left lower leg limited to breakdown of skin: Secondary | ICD-10-CM | POA: Diagnosis not present

## 2019-05-16 DIAGNOSIS — N182 Chronic kidney disease, stage 2 (mild): Secondary | ICD-10-CM | POA: Diagnosis not present

## 2019-05-16 DIAGNOSIS — E11622 Type 2 diabetes mellitus with other skin ulcer: Secondary | ICD-10-CM | POA: Diagnosis not present

## 2019-05-16 DIAGNOSIS — I129 Hypertensive chronic kidney disease with stage 1 through stage 4 chronic kidney disease, or unspecified chronic kidney disease: Secondary | ICD-10-CM | POA: Diagnosis not present

## 2019-05-16 DIAGNOSIS — E1122 Type 2 diabetes mellitus with diabetic chronic kidney disease: Secondary | ICD-10-CM | POA: Diagnosis not present

## 2019-05-16 DIAGNOSIS — E1151 Type 2 diabetes mellitus with diabetic peripheral angiopathy without gangrene: Secondary | ICD-10-CM | POA: Diagnosis not present

## 2019-05-16 DIAGNOSIS — L97821 Non-pressure chronic ulcer of other part of left lower leg limited to breakdown of skin: Secondary | ICD-10-CM | POA: Diagnosis not present

## 2019-05-17 DIAGNOSIS — E11622 Type 2 diabetes mellitus with other skin ulcer: Secondary | ICD-10-CM | POA: Diagnosis not present

## 2019-05-17 DIAGNOSIS — E1122 Type 2 diabetes mellitus with diabetic chronic kidney disease: Secondary | ICD-10-CM | POA: Diagnosis not present

## 2019-05-17 DIAGNOSIS — N182 Chronic kidney disease, stage 2 (mild): Secondary | ICD-10-CM | POA: Diagnosis not present

## 2019-05-17 DIAGNOSIS — E1151 Type 2 diabetes mellitus with diabetic peripheral angiopathy without gangrene: Secondary | ICD-10-CM | POA: Diagnosis not present

## 2019-05-17 DIAGNOSIS — I129 Hypertensive chronic kidney disease with stage 1 through stage 4 chronic kidney disease, or unspecified chronic kidney disease: Secondary | ICD-10-CM | POA: Diagnosis not present

## 2019-05-17 DIAGNOSIS — L97821 Non-pressure chronic ulcer of other part of left lower leg limited to breakdown of skin: Secondary | ICD-10-CM | POA: Diagnosis not present

## 2019-05-18 DIAGNOSIS — L97821 Non-pressure chronic ulcer of other part of left lower leg limited to breakdown of skin: Secondary | ICD-10-CM | POA: Diagnosis not present

## 2019-05-18 DIAGNOSIS — N182 Chronic kidney disease, stage 2 (mild): Secondary | ICD-10-CM | POA: Diagnosis not present

## 2019-05-18 DIAGNOSIS — E11622 Type 2 diabetes mellitus with other skin ulcer: Secondary | ICD-10-CM | POA: Diagnosis not present

## 2019-05-18 DIAGNOSIS — E1151 Type 2 diabetes mellitus with diabetic peripheral angiopathy without gangrene: Secondary | ICD-10-CM | POA: Diagnosis not present

## 2019-05-18 DIAGNOSIS — I129 Hypertensive chronic kidney disease with stage 1 through stage 4 chronic kidney disease, or unspecified chronic kidney disease: Secondary | ICD-10-CM | POA: Diagnosis not present

## 2019-05-18 DIAGNOSIS — E1122 Type 2 diabetes mellitus with diabetic chronic kidney disease: Secondary | ICD-10-CM | POA: Diagnosis not present

## 2019-05-22 DIAGNOSIS — E1122 Type 2 diabetes mellitus with diabetic chronic kidney disease: Secondary | ICD-10-CM | POA: Diagnosis not present

## 2019-05-22 DIAGNOSIS — E11622 Type 2 diabetes mellitus with other skin ulcer: Secondary | ICD-10-CM | POA: Diagnosis not present

## 2019-05-22 DIAGNOSIS — L97821 Non-pressure chronic ulcer of other part of left lower leg limited to breakdown of skin: Secondary | ICD-10-CM | POA: Diagnosis not present

## 2019-05-22 DIAGNOSIS — I129 Hypertensive chronic kidney disease with stage 1 through stage 4 chronic kidney disease, or unspecified chronic kidney disease: Secondary | ICD-10-CM | POA: Diagnosis not present

## 2019-05-22 DIAGNOSIS — E1151 Type 2 diabetes mellitus with diabetic peripheral angiopathy without gangrene: Secondary | ICD-10-CM | POA: Diagnosis not present

## 2019-05-22 DIAGNOSIS — N182 Chronic kidney disease, stage 2 (mild): Secondary | ICD-10-CM | POA: Diagnosis not present

## 2019-05-24 DIAGNOSIS — L97821 Non-pressure chronic ulcer of other part of left lower leg limited to breakdown of skin: Secondary | ICD-10-CM | POA: Diagnosis not present

## 2019-05-24 DIAGNOSIS — E1122 Type 2 diabetes mellitus with diabetic chronic kidney disease: Secondary | ICD-10-CM | POA: Diagnosis not present

## 2019-05-24 DIAGNOSIS — I129 Hypertensive chronic kidney disease with stage 1 through stage 4 chronic kidney disease, or unspecified chronic kidney disease: Secondary | ICD-10-CM | POA: Diagnosis not present

## 2019-05-24 DIAGNOSIS — E1151 Type 2 diabetes mellitus with diabetic peripheral angiopathy without gangrene: Secondary | ICD-10-CM | POA: Diagnosis not present

## 2019-05-24 DIAGNOSIS — E11622 Type 2 diabetes mellitus with other skin ulcer: Secondary | ICD-10-CM | POA: Diagnosis not present

## 2019-05-24 DIAGNOSIS — N182 Chronic kidney disease, stage 2 (mild): Secondary | ICD-10-CM | POA: Diagnosis not present

## 2019-05-25 DIAGNOSIS — E1151 Type 2 diabetes mellitus with diabetic peripheral angiopathy without gangrene: Secondary | ICD-10-CM | POA: Diagnosis not present

## 2019-05-25 DIAGNOSIS — N182 Chronic kidney disease, stage 2 (mild): Secondary | ICD-10-CM | POA: Diagnosis not present

## 2019-05-25 DIAGNOSIS — L97821 Non-pressure chronic ulcer of other part of left lower leg limited to breakdown of skin: Secondary | ICD-10-CM | POA: Diagnosis not present

## 2019-05-25 DIAGNOSIS — E1122 Type 2 diabetes mellitus with diabetic chronic kidney disease: Secondary | ICD-10-CM | POA: Diagnosis not present

## 2019-05-25 DIAGNOSIS — E11622 Type 2 diabetes mellitus with other skin ulcer: Secondary | ICD-10-CM | POA: Diagnosis not present

## 2019-05-25 DIAGNOSIS — I129 Hypertensive chronic kidney disease with stage 1 through stage 4 chronic kidney disease, or unspecified chronic kidney disease: Secondary | ICD-10-CM | POA: Diagnosis not present

## 2019-05-29 DIAGNOSIS — I129 Hypertensive chronic kidney disease with stage 1 through stage 4 chronic kidney disease, or unspecified chronic kidney disease: Secondary | ICD-10-CM | POA: Diagnosis not present

## 2019-05-29 DIAGNOSIS — D631 Anemia in chronic kidney disease: Secondary | ICD-10-CM | POA: Diagnosis not present

## 2019-05-29 DIAGNOSIS — C61 Malignant neoplasm of prostate: Secondary | ICD-10-CM | POA: Diagnosis not present

## 2019-05-29 DIAGNOSIS — E1122 Type 2 diabetes mellitus with diabetic chronic kidney disease: Secondary | ICD-10-CM | POA: Diagnosis not present

## 2019-05-29 DIAGNOSIS — E1151 Type 2 diabetes mellitus with diabetic peripheral angiopathy without gangrene: Secondary | ICD-10-CM | POA: Diagnosis not present

## 2019-05-29 DIAGNOSIS — N182 Chronic kidney disease, stage 2 (mild): Secondary | ICD-10-CM | POA: Diagnosis not present

## 2019-05-29 DIAGNOSIS — E039 Hypothyroidism, unspecified: Secondary | ICD-10-CM | POA: Diagnosis not present

## 2019-05-29 DIAGNOSIS — Z4800 Encounter for change or removal of nonsurgical wound dressing: Secondary | ICD-10-CM | POA: Diagnosis not present

## 2019-05-29 DIAGNOSIS — E11319 Type 2 diabetes mellitus with unspecified diabetic retinopathy without macular edema: Secondary | ICD-10-CM | POA: Diagnosis not present

## 2019-05-29 DIAGNOSIS — Z7982 Long term (current) use of aspirin: Secondary | ICD-10-CM | POA: Diagnosis not present

## 2019-05-29 DIAGNOSIS — Z794 Long term (current) use of insulin: Secondary | ICD-10-CM | POA: Diagnosis not present

## 2019-05-29 DIAGNOSIS — E46 Unspecified protein-calorie malnutrition: Secondary | ICD-10-CM | POA: Diagnosis not present

## 2019-05-29 DIAGNOSIS — F419 Anxiety disorder, unspecified: Secondary | ICD-10-CM | POA: Diagnosis not present

## 2019-06-06 DIAGNOSIS — N182 Chronic kidney disease, stage 2 (mild): Secondary | ICD-10-CM | POA: Diagnosis not present

## 2019-06-06 DIAGNOSIS — E1122 Type 2 diabetes mellitus with diabetic chronic kidney disease: Secondary | ICD-10-CM | POA: Diagnosis not present

## 2019-06-06 DIAGNOSIS — D631 Anemia in chronic kidney disease: Secondary | ICD-10-CM | POA: Diagnosis not present

## 2019-06-06 DIAGNOSIS — E46 Unspecified protein-calorie malnutrition: Secondary | ICD-10-CM | POA: Diagnosis not present

## 2019-06-06 DIAGNOSIS — E1151 Type 2 diabetes mellitus with diabetic peripheral angiopathy without gangrene: Secondary | ICD-10-CM | POA: Diagnosis not present

## 2019-06-06 DIAGNOSIS — I129 Hypertensive chronic kidney disease with stage 1 through stage 4 chronic kidney disease, or unspecified chronic kidney disease: Secondary | ICD-10-CM | POA: Diagnosis not present

## 2019-06-08 DIAGNOSIS — E46 Unspecified protein-calorie malnutrition: Secondary | ICD-10-CM | POA: Diagnosis not present

## 2019-06-08 DIAGNOSIS — I129 Hypertensive chronic kidney disease with stage 1 through stage 4 chronic kidney disease, or unspecified chronic kidney disease: Secondary | ICD-10-CM | POA: Diagnosis not present

## 2019-06-08 DIAGNOSIS — D631 Anemia in chronic kidney disease: Secondary | ICD-10-CM | POA: Diagnosis not present

## 2019-06-08 DIAGNOSIS — E1122 Type 2 diabetes mellitus with diabetic chronic kidney disease: Secondary | ICD-10-CM | POA: Diagnosis not present

## 2019-06-08 DIAGNOSIS — E1151 Type 2 diabetes mellitus with diabetic peripheral angiopathy without gangrene: Secondary | ICD-10-CM | POA: Diagnosis not present

## 2019-06-08 DIAGNOSIS — N182 Chronic kidney disease, stage 2 (mild): Secondary | ICD-10-CM | POA: Diagnosis not present

## 2019-06-12 DIAGNOSIS — E1151 Type 2 diabetes mellitus with diabetic peripheral angiopathy without gangrene: Secondary | ICD-10-CM | POA: Diagnosis not present

## 2019-06-12 DIAGNOSIS — D631 Anemia in chronic kidney disease: Secondary | ICD-10-CM | POA: Diagnosis not present

## 2019-06-12 DIAGNOSIS — I129 Hypertensive chronic kidney disease with stage 1 through stage 4 chronic kidney disease, or unspecified chronic kidney disease: Secondary | ICD-10-CM | POA: Diagnosis not present

## 2019-06-12 DIAGNOSIS — N182 Chronic kidney disease, stage 2 (mild): Secondary | ICD-10-CM | POA: Diagnosis not present

## 2019-06-12 DIAGNOSIS — E46 Unspecified protein-calorie malnutrition: Secondary | ICD-10-CM | POA: Diagnosis not present

## 2019-06-12 DIAGNOSIS — E1122 Type 2 diabetes mellitus with diabetic chronic kidney disease: Secondary | ICD-10-CM | POA: Diagnosis not present

## 2019-06-14 DIAGNOSIS — D631 Anemia in chronic kidney disease: Secondary | ICD-10-CM | POA: Diagnosis not present

## 2019-06-14 DIAGNOSIS — E46 Unspecified protein-calorie malnutrition: Secondary | ICD-10-CM | POA: Diagnosis not present

## 2019-06-14 DIAGNOSIS — I129 Hypertensive chronic kidney disease with stage 1 through stage 4 chronic kidney disease, or unspecified chronic kidney disease: Secondary | ICD-10-CM | POA: Diagnosis not present

## 2019-06-14 DIAGNOSIS — E1151 Type 2 diabetes mellitus with diabetic peripheral angiopathy without gangrene: Secondary | ICD-10-CM | POA: Diagnosis not present

## 2019-06-14 DIAGNOSIS — E1122 Type 2 diabetes mellitus with diabetic chronic kidney disease: Secondary | ICD-10-CM | POA: Diagnosis not present

## 2019-06-14 DIAGNOSIS — N182 Chronic kidney disease, stage 2 (mild): Secondary | ICD-10-CM | POA: Diagnosis not present

## 2019-06-19 DIAGNOSIS — N182 Chronic kidney disease, stage 2 (mild): Secondary | ICD-10-CM | POA: Diagnosis not present

## 2019-06-19 DIAGNOSIS — I129 Hypertensive chronic kidney disease with stage 1 through stage 4 chronic kidney disease, or unspecified chronic kidney disease: Secondary | ICD-10-CM | POA: Diagnosis not present

## 2019-06-19 DIAGNOSIS — E1122 Type 2 diabetes mellitus with diabetic chronic kidney disease: Secondary | ICD-10-CM | POA: Diagnosis not present

## 2019-06-19 DIAGNOSIS — E46 Unspecified protein-calorie malnutrition: Secondary | ICD-10-CM | POA: Diagnosis not present

## 2019-06-19 DIAGNOSIS — D631 Anemia in chronic kidney disease: Secondary | ICD-10-CM | POA: Diagnosis not present

## 2019-06-19 DIAGNOSIS — E1151 Type 2 diabetes mellitus with diabetic peripheral angiopathy without gangrene: Secondary | ICD-10-CM | POA: Diagnosis not present

## 2019-06-21 DIAGNOSIS — I129 Hypertensive chronic kidney disease with stage 1 through stage 4 chronic kidney disease, or unspecified chronic kidney disease: Secondary | ICD-10-CM | POA: Diagnosis not present

## 2019-06-21 DIAGNOSIS — D631 Anemia in chronic kidney disease: Secondary | ICD-10-CM | POA: Diagnosis not present

## 2019-06-21 DIAGNOSIS — E1122 Type 2 diabetes mellitus with diabetic chronic kidney disease: Secondary | ICD-10-CM | POA: Diagnosis not present

## 2019-06-21 DIAGNOSIS — E1151 Type 2 diabetes mellitus with diabetic peripheral angiopathy without gangrene: Secondary | ICD-10-CM | POA: Diagnosis not present

## 2019-06-21 DIAGNOSIS — N182 Chronic kidney disease, stage 2 (mild): Secondary | ICD-10-CM | POA: Diagnosis not present

## 2019-06-21 DIAGNOSIS — E46 Unspecified protein-calorie malnutrition: Secondary | ICD-10-CM | POA: Diagnosis not present

## 2019-06-22 DIAGNOSIS — E46 Unspecified protein-calorie malnutrition: Secondary | ICD-10-CM | POA: Diagnosis not present

## 2019-06-22 DIAGNOSIS — D631 Anemia in chronic kidney disease: Secondary | ICD-10-CM | POA: Diagnosis not present

## 2019-06-22 DIAGNOSIS — I129 Hypertensive chronic kidney disease with stage 1 through stage 4 chronic kidney disease, or unspecified chronic kidney disease: Secondary | ICD-10-CM | POA: Diagnosis not present

## 2019-06-22 DIAGNOSIS — N182 Chronic kidney disease, stage 2 (mild): Secondary | ICD-10-CM | POA: Diagnosis not present

## 2019-06-22 DIAGNOSIS — E1151 Type 2 diabetes mellitus with diabetic peripheral angiopathy without gangrene: Secondary | ICD-10-CM | POA: Diagnosis not present

## 2019-06-22 DIAGNOSIS — E1122 Type 2 diabetes mellitus with diabetic chronic kidney disease: Secondary | ICD-10-CM | POA: Diagnosis not present

## 2019-06-23 DIAGNOSIS — I129 Hypertensive chronic kidney disease with stage 1 through stage 4 chronic kidney disease, or unspecified chronic kidney disease: Secondary | ICD-10-CM | POA: Diagnosis not present

## 2019-06-23 DIAGNOSIS — E1122 Type 2 diabetes mellitus with diabetic chronic kidney disease: Secondary | ICD-10-CM | POA: Diagnosis not present

## 2019-06-23 DIAGNOSIS — N182 Chronic kidney disease, stage 2 (mild): Secondary | ICD-10-CM | POA: Diagnosis not present

## 2019-06-23 DIAGNOSIS — E1151 Type 2 diabetes mellitus with diabetic peripheral angiopathy without gangrene: Secondary | ICD-10-CM | POA: Diagnosis not present

## 2019-06-23 DIAGNOSIS — D631 Anemia in chronic kidney disease: Secondary | ICD-10-CM | POA: Diagnosis not present

## 2019-06-23 DIAGNOSIS — E46 Unspecified protein-calorie malnutrition: Secondary | ICD-10-CM | POA: Diagnosis not present

## 2019-06-27 DIAGNOSIS — E1122 Type 2 diabetes mellitus with diabetic chronic kidney disease: Secondary | ICD-10-CM | POA: Diagnosis not present

## 2019-06-27 DIAGNOSIS — E46 Unspecified protein-calorie malnutrition: Secondary | ICD-10-CM | POA: Diagnosis not present

## 2019-06-27 DIAGNOSIS — E1151 Type 2 diabetes mellitus with diabetic peripheral angiopathy without gangrene: Secondary | ICD-10-CM | POA: Diagnosis not present

## 2019-06-27 DIAGNOSIS — N182 Chronic kidney disease, stage 2 (mild): Secondary | ICD-10-CM | POA: Diagnosis not present

## 2019-06-27 DIAGNOSIS — D631 Anemia in chronic kidney disease: Secondary | ICD-10-CM | POA: Diagnosis not present

## 2019-06-27 DIAGNOSIS — I129 Hypertensive chronic kidney disease with stage 1 through stage 4 chronic kidney disease, or unspecified chronic kidney disease: Secondary | ICD-10-CM | POA: Diagnosis not present

## 2019-06-28 DIAGNOSIS — E1122 Type 2 diabetes mellitus with diabetic chronic kidney disease: Secondary | ICD-10-CM | POA: Diagnosis not present

## 2019-06-28 DIAGNOSIS — I129 Hypertensive chronic kidney disease with stage 1 through stage 4 chronic kidney disease, or unspecified chronic kidney disease: Secondary | ICD-10-CM | POA: Diagnosis not present

## 2019-06-28 DIAGNOSIS — E1151 Type 2 diabetes mellitus with diabetic peripheral angiopathy without gangrene: Secondary | ICD-10-CM | POA: Diagnosis not present

## 2019-06-28 DIAGNOSIS — D631 Anemia in chronic kidney disease: Secondary | ICD-10-CM | POA: Diagnosis not present

## 2019-06-28 DIAGNOSIS — E11319 Type 2 diabetes mellitus with unspecified diabetic retinopathy without macular edema: Secondary | ICD-10-CM | POA: Diagnosis not present

## 2019-06-28 DIAGNOSIS — C61 Malignant neoplasm of prostate: Secondary | ICD-10-CM | POA: Diagnosis not present

## 2019-06-28 DIAGNOSIS — E46 Unspecified protein-calorie malnutrition: Secondary | ICD-10-CM | POA: Diagnosis not present

## 2019-06-28 DIAGNOSIS — Z7982 Long term (current) use of aspirin: Secondary | ICD-10-CM | POA: Diagnosis not present

## 2019-06-28 DIAGNOSIS — Z794 Long term (current) use of insulin: Secondary | ICD-10-CM | POA: Diagnosis not present

## 2019-06-28 DIAGNOSIS — Z4800 Encounter for change or removal of nonsurgical wound dressing: Secondary | ICD-10-CM | POA: Diagnosis not present

## 2019-06-28 DIAGNOSIS — F419 Anxiety disorder, unspecified: Secondary | ICD-10-CM | POA: Diagnosis not present

## 2019-06-28 DIAGNOSIS — E039 Hypothyroidism, unspecified: Secondary | ICD-10-CM | POA: Diagnosis not present

## 2019-06-28 DIAGNOSIS — N182 Chronic kidney disease, stage 2 (mild): Secondary | ICD-10-CM | POA: Diagnosis not present

## 2019-06-30 DIAGNOSIS — E46 Unspecified protein-calorie malnutrition: Secondary | ICD-10-CM | POA: Diagnosis not present

## 2019-06-30 DIAGNOSIS — D631 Anemia in chronic kidney disease: Secondary | ICD-10-CM | POA: Diagnosis not present

## 2019-06-30 DIAGNOSIS — E1151 Type 2 diabetes mellitus with diabetic peripheral angiopathy without gangrene: Secondary | ICD-10-CM | POA: Diagnosis not present

## 2019-06-30 DIAGNOSIS — I129 Hypertensive chronic kidney disease with stage 1 through stage 4 chronic kidney disease, or unspecified chronic kidney disease: Secondary | ICD-10-CM | POA: Diagnosis not present

## 2019-06-30 DIAGNOSIS — E1122 Type 2 diabetes mellitus with diabetic chronic kidney disease: Secondary | ICD-10-CM | POA: Diagnosis not present

## 2019-06-30 DIAGNOSIS — N182 Chronic kidney disease, stage 2 (mild): Secondary | ICD-10-CM | POA: Diagnosis not present

## 2019-07-03 DIAGNOSIS — N182 Chronic kidney disease, stage 2 (mild): Secondary | ICD-10-CM | POA: Diagnosis not present

## 2019-07-03 DIAGNOSIS — E46 Unspecified protein-calorie malnutrition: Secondary | ICD-10-CM | POA: Diagnosis not present

## 2019-07-03 DIAGNOSIS — E1122 Type 2 diabetes mellitus with diabetic chronic kidney disease: Secondary | ICD-10-CM | POA: Diagnosis not present

## 2019-07-03 DIAGNOSIS — D631 Anemia in chronic kidney disease: Secondary | ICD-10-CM | POA: Diagnosis not present

## 2019-07-03 DIAGNOSIS — I129 Hypertensive chronic kidney disease with stage 1 through stage 4 chronic kidney disease, or unspecified chronic kidney disease: Secondary | ICD-10-CM | POA: Diagnosis not present

## 2019-07-03 DIAGNOSIS — E1151 Type 2 diabetes mellitus with diabetic peripheral angiopathy without gangrene: Secondary | ICD-10-CM | POA: Diagnosis not present

## 2019-07-07 DIAGNOSIS — N182 Chronic kidney disease, stage 2 (mild): Secondary | ICD-10-CM | POA: Diagnosis not present

## 2019-07-07 DIAGNOSIS — E1151 Type 2 diabetes mellitus with diabetic peripheral angiopathy without gangrene: Secondary | ICD-10-CM | POA: Diagnosis not present

## 2019-07-07 DIAGNOSIS — E1122 Type 2 diabetes mellitus with diabetic chronic kidney disease: Secondary | ICD-10-CM | POA: Diagnosis not present

## 2019-07-07 DIAGNOSIS — E46 Unspecified protein-calorie malnutrition: Secondary | ICD-10-CM | POA: Diagnosis not present

## 2019-07-07 DIAGNOSIS — I129 Hypertensive chronic kidney disease with stage 1 through stage 4 chronic kidney disease, or unspecified chronic kidney disease: Secondary | ICD-10-CM | POA: Diagnosis not present

## 2019-07-07 DIAGNOSIS — D631 Anemia in chronic kidney disease: Secondary | ICD-10-CM | POA: Diagnosis not present

## 2019-07-11 DIAGNOSIS — I129 Hypertensive chronic kidney disease with stage 1 through stage 4 chronic kidney disease, or unspecified chronic kidney disease: Secondary | ICD-10-CM | POA: Diagnosis not present

## 2019-07-11 DIAGNOSIS — E1151 Type 2 diabetes mellitus with diabetic peripheral angiopathy without gangrene: Secondary | ICD-10-CM | POA: Diagnosis not present

## 2019-07-11 DIAGNOSIS — E1122 Type 2 diabetes mellitus with diabetic chronic kidney disease: Secondary | ICD-10-CM | POA: Diagnosis not present

## 2019-07-11 DIAGNOSIS — D631 Anemia in chronic kidney disease: Secondary | ICD-10-CM | POA: Diagnosis not present

## 2019-07-11 DIAGNOSIS — N182 Chronic kidney disease, stage 2 (mild): Secondary | ICD-10-CM | POA: Diagnosis not present

## 2019-07-11 DIAGNOSIS — E46 Unspecified protein-calorie malnutrition: Secondary | ICD-10-CM | POA: Diagnosis not present

## 2019-07-14 DIAGNOSIS — E46 Unspecified protein-calorie malnutrition: Secondary | ICD-10-CM | POA: Diagnosis not present

## 2019-07-14 DIAGNOSIS — N182 Chronic kidney disease, stage 2 (mild): Secondary | ICD-10-CM | POA: Diagnosis not present

## 2019-07-14 DIAGNOSIS — D631 Anemia in chronic kidney disease: Secondary | ICD-10-CM | POA: Diagnosis not present

## 2019-07-14 DIAGNOSIS — E1151 Type 2 diabetes mellitus with diabetic peripheral angiopathy without gangrene: Secondary | ICD-10-CM | POA: Diagnosis not present

## 2019-07-14 DIAGNOSIS — E1122 Type 2 diabetes mellitus with diabetic chronic kidney disease: Secondary | ICD-10-CM | POA: Diagnosis not present

## 2019-07-14 DIAGNOSIS — I129 Hypertensive chronic kidney disease with stage 1 through stage 4 chronic kidney disease, or unspecified chronic kidney disease: Secondary | ICD-10-CM | POA: Diagnosis not present

## 2019-07-18 DIAGNOSIS — I129 Hypertensive chronic kidney disease with stage 1 through stage 4 chronic kidney disease, or unspecified chronic kidney disease: Secondary | ICD-10-CM | POA: Diagnosis not present

## 2019-07-18 DIAGNOSIS — E46 Unspecified protein-calorie malnutrition: Secondary | ICD-10-CM | POA: Diagnosis not present

## 2019-07-18 DIAGNOSIS — E1122 Type 2 diabetes mellitus with diabetic chronic kidney disease: Secondary | ICD-10-CM | POA: Diagnosis not present

## 2019-07-18 DIAGNOSIS — E1151 Type 2 diabetes mellitus with diabetic peripheral angiopathy without gangrene: Secondary | ICD-10-CM | POA: Diagnosis not present

## 2019-07-18 DIAGNOSIS — D631 Anemia in chronic kidney disease: Secondary | ICD-10-CM | POA: Diagnosis not present

## 2019-07-18 DIAGNOSIS — N182 Chronic kidney disease, stage 2 (mild): Secondary | ICD-10-CM | POA: Diagnosis not present

## 2019-07-21 DIAGNOSIS — E1151 Type 2 diabetes mellitus with diabetic peripheral angiopathy without gangrene: Secondary | ICD-10-CM | POA: Diagnosis not present

## 2019-07-21 DIAGNOSIS — D631 Anemia in chronic kidney disease: Secondary | ICD-10-CM | POA: Diagnosis not present

## 2019-07-21 DIAGNOSIS — E46 Unspecified protein-calorie malnutrition: Secondary | ICD-10-CM | POA: Diagnosis not present

## 2019-07-21 DIAGNOSIS — I129 Hypertensive chronic kidney disease with stage 1 through stage 4 chronic kidney disease, or unspecified chronic kidney disease: Secondary | ICD-10-CM | POA: Diagnosis not present

## 2019-07-21 DIAGNOSIS — N182 Chronic kidney disease, stage 2 (mild): Secondary | ICD-10-CM | POA: Diagnosis not present

## 2019-07-21 DIAGNOSIS — E1122 Type 2 diabetes mellitus with diabetic chronic kidney disease: Secondary | ICD-10-CM | POA: Diagnosis not present

## 2019-07-25 DIAGNOSIS — I129 Hypertensive chronic kidney disease with stage 1 through stage 4 chronic kidney disease, or unspecified chronic kidney disease: Secondary | ICD-10-CM | POA: Diagnosis not present

## 2019-07-25 DIAGNOSIS — E1151 Type 2 diabetes mellitus with diabetic peripheral angiopathy without gangrene: Secondary | ICD-10-CM | POA: Diagnosis not present

## 2019-07-25 DIAGNOSIS — D631 Anemia in chronic kidney disease: Secondary | ICD-10-CM | POA: Diagnosis not present

## 2019-07-25 DIAGNOSIS — N182 Chronic kidney disease, stage 2 (mild): Secondary | ICD-10-CM | POA: Diagnosis not present

## 2019-07-25 DIAGNOSIS — E46 Unspecified protein-calorie malnutrition: Secondary | ICD-10-CM | POA: Diagnosis not present

## 2019-07-25 DIAGNOSIS — E1122 Type 2 diabetes mellitus with diabetic chronic kidney disease: Secondary | ICD-10-CM | POA: Diagnosis not present

## 2019-07-28 DIAGNOSIS — E039 Hypothyroidism, unspecified: Secondary | ICD-10-CM | POA: Diagnosis not present

## 2019-07-28 DIAGNOSIS — E1122 Type 2 diabetes mellitus with diabetic chronic kidney disease: Secondary | ICD-10-CM | POA: Diagnosis not present

## 2019-07-28 DIAGNOSIS — I129 Hypertensive chronic kidney disease with stage 1 through stage 4 chronic kidney disease, or unspecified chronic kidney disease: Secondary | ICD-10-CM | POA: Diagnosis not present

## 2019-07-28 DIAGNOSIS — L853 Xerosis cutis: Secondary | ICD-10-CM | POA: Diagnosis not present

## 2019-07-28 DIAGNOSIS — N182 Chronic kidney disease, stage 2 (mild): Secondary | ICD-10-CM | POA: Diagnosis not present

## 2019-07-28 DIAGNOSIS — Z794 Long term (current) use of insulin: Secondary | ICD-10-CM | POA: Diagnosis not present

## 2019-07-28 DIAGNOSIS — E1151 Type 2 diabetes mellitus with diabetic peripheral angiopathy without gangrene: Secondary | ICD-10-CM | POA: Diagnosis not present

## 2019-07-28 DIAGNOSIS — E11622 Type 2 diabetes mellitus with other skin ulcer: Secondary | ICD-10-CM | POA: Diagnosis not present

## 2019-07-28 DIAGNOSIS — Z4800 Encounter for change or removal of nonsurgical wound dressing: Secondary | ICD-10-CM | POA: Diagnosis not present

## 2019-07-28 DIAGNOSIS — E11319 Type 2 diabetes mellitus with unspecified diabetic retinopathy without macular edema: Secondary | ICD-10-CM | POA: Diagnosis not present

## 2019-07-28 DIAGNOSIS — Z7982 Long term (current) use of aspirin: Secondary | ICD-10-CM | POA: Diagnosis not present

## 2019-07-28 DIAGNOSIS — C61 Malignant neoplasm of prostate: Secondary | ICD-10-CM | POA: Diagnosis not present

## 2019-07-28 DIAGNOSIS — D631 Anemia in chronic kidney disease: Secondary | ICD-10-CM | POA: Diagnosis not present

## 2019-07-28 DIAGNOSIS — F419 Anxiety disorder, unspecified: Secondary | ICD-10-CM | POA: Diagnosis not present

## 2019-07-28 DIAGNOSIS — L97211 Non-pressure chronic ulcer of right calf limited to breakdown of skin: Secondary | ICD-10-CM | POA: Diagnosis not present

## 2019-08-01 DIAGNOSIS — Z4800 Encounter for change or removal of nonsurgical wound dressing: Secondary | ICD-10-CM | POA: Diagnosis not present

## 2019-08-01 DIAGNOSIS — E11622 Type 2 diabetes mellitus with other skin ulcer: Secondary | ICD-10-CM | POA: Diagnosis not present

## 2019-08-01 DIAGNOSIS — E1151 Type 2 diabetes mellitus with diabetic peripheral angiopathy without gangrene: Secondary | ICD-10-CM | POA: Diagnosis not present

## 2019-08-01 DIAGNOSIS — L853 Xerosis cutis: Secondary | ICD-10-CM | POA: Diagnosis not present

## 2019-08-01 DIAGNOSIS — L97211 Non-pressure chronic ulcer of right calf limited to breakdown of skin: Secondary | ICD-10-CM | POA: Diagnosis not present

## 2019-08-01 DIAGNOSIS — I129 Hypertensive chronic kidney disease with stage 1 through stage 4 chronic kidney disease, or unspecified chronic kidney disease: Secondary | ICD-10-CM | POA: Diagnosis not present

## 2019-08-03 DIAGNOSIS — Z961 Presence of intraocular lens: Secondary | ICD-10-CM | POA: Diagnosis not present

## 2019-08-03 DIAGNOSIS — E119 Type 2 diabetes mellitus without complications: Secondary | ICD-10-CM | POA: Diagnosis not present

## 2019-08-04 DIAGNOSIS — I129 Hypertensive chronic kidney disease with stage 1 through stage 4 chronic kidney disease, or unspecified chronic kidney disease: Secondary | ICD-10-CM | POA: Diagnosis not present

## 2019-08-04 DIAGNOSIS — E1151 Type 2 diabetes mellitus with diabetic peripheral angiopathy without gangrene: Secondary | ICD-10-CM | POA: Diagnosis not present

## 2019-08-04 DIAGNOSIS — E11622 Type 2 diabetes mellitus with other skin ulcer: Secondary | ICD-10-CM | POA: Diagnosis not present

## 2019-08-04 DIAGNOSIS — L853 Xerosis cutis: Secondary | ICD-10-CM | POA: Diagnosis not present

## 2019-08-04 DIAGNOSIS — L97211 Non-pressure chronic ulcer of right calf limited to breakdown of skin: Secondary | ICD-10-CM | POA: Diagnosis not present

## 2019-08-04 DIAGNOSIS — Z4800 Encounter for change or removal of nonsurgical wound dressing: Secondary | ICD-10-CM | POA: Diagnosis not present

## 2019-08-06 ENCOUNTER — Ambulatory Visit: Payer: Medicare Other | Attending: Internal Medicine

## 2019-08-06 DIAGNOSIS — Z23 Encounter for immunization: Secondary | ICD-10-CM | POA: Insufficient documentation

## 2019-08-06 NOTE — Progress Notes (Signed)
   Covid-19 Vaccination Clinic  Name:  Matthew Parsons    MRN: [id number] DOB: May 17, 1927  08/06/2019  [name] was observed post Covid-19 immunization for 15 minutes without incidence. He was provided with Vaccine Information Sheet and instruction to access the V-Safe system.   [name] was instructed to call 911 with any severe reactions post vaccine: Marland Kitchen Difficulty breathing  . Swelling of your face and throat  . A fast heartbeat  . A bad rash all over your body  . Dizziness and weakness    Immunizations Administered    Name Date Dose VIS Date Route   Pfizer COVID-19 Vaccine 08/06/2019 10:53 AM 0.3 mL 05/19/2019 Intramuscular   Manufacturer: [place]   Lot: [id number]   [place]: [id number]

## 2019-08-07 DIAGNOSIS — Z8546 Personal history of malignant neoplasm of prostate: Secondary | ICD-10-CM | POA: Diagnosis not present

## 2019-08-08 DIAGNOSIS — E11622 Type 2 diabetes mellitus with other skin ulcer: Secondary | ICD-10-CM | POA: Diagnosis not present

## 2019-08-08 DIAGNOSIS — L97211 Non-pressure chronic ulcer of right calf limited to breakdown of skin: Secondary | ICD-10-CM | POA: Diagnosis not present

## 2019-08-08 DIAGNOSIS — E1151 Type 2 diabetes mellitus with diabetic peripheral angiopathy without gangrene: Secondary | ICD-10-CM | POA: Diagnosis not present

## 2019-08-08 DIAGNOSIS — I129 Hypertensive chronic kidney disease with stage 1 through stage 4 chronic kidney disease, or unspecified chronic kidney disease: Secondary | ICD-10-CM | POA: Diagnosis not present

## 2019-08-08 DIAGNOSIS — Z4800 Encounter for change or removal of nonsurgical wound dressing: Secondary | ICD-10-CM | POA: Diagnosis not present

## 2019-08-08 DIAGNOSIS — L853 Xerosis cutis: Secondary | ICD-10-CM | POA: Diagnosis not present

## 2019-08-11 DIAGNOSIS — L97211 Non-pressure chronic ulcer of right calf limited to breakdown of skin: Secondary | ICD-10-CM | POA: Diagnosis not present

## 2019-08-11 DIAGNOSIS — Z4800 Encounter for change or removal of nonsurgical wound dressing: Secondary | ICD-10-CM | POA: Diagnosis not present

## 2019-08-11 DIAGNOSIS — L853 Xerosis cutis: Secondary | ICD-10-CM | POA: Diagnosis not present

## 2019-08-11 DIAGNOSIS — E1151 Type 2 diabetes mellitus with diabetic peripheral angiopathy without gangrene: Secondary | ICD-10-CM | POA: Diagnosis not present

## 2019-08-11 DIAGNOSIS — I129 Hypertensive chronic kidney disease with stage 1 through stage 4 chronic kidney disease, or unspecified chronic kidney disease: Secondary | ICD-10-CM | POA: Diagnosis not present

## 2019-08-11 DIAGNOSIS — E11622 Type 2 diabetes mellitus with other skin ulcer: Secondary | ICD-10-CM | POA: Diagnosis not present

## 2019-08-14 DIAGNOSIS — N138 Other obstructive and reflux uropathy: Secondary | ICD-10-CM | POA: Diagnosis not present

## 2019-08-14 DIAGNOSIS — C61 Malignant neoplasm of prostate: Secondary | ICD-10-CM | POA: Diagnosis not present

## 2019-08-14 DIAGNOSIS — C7951 Secondary malignant neoplasm of bone: Secondary | ICD-10-CM | POA: Diagnosis not present

## 2019-08-15 DIAGNOSIS — Z4800 Encounter for change or removal of nonsurgical wound dressing: Secondary | ICD-10-CM | POA: Diagnosis not present

## 2019-08-15 DIAGNOSIS — L97211 Non-pressure chronic ulcer of right calf limited to breakdown of skin: Secondary | ICD-10-CM | POA: Diagnosis not present

## 2019-08-15 DIAGNOSIS — E11622 Type 2 diabetes mellitus with other skin ulcer: Secondary | ICD-10-CM | POA: Diagnosis not present

## 2019-08-15 DIAGNOSIS — E1151 Type 2 diabetes mellitus with diabetic peripheral angiopathy without gangrene: Secondary | ICD-10-CM | POA: Diagnosis not present

## 2019-08-15 DIAGNOSIS — L853 Xerosis cutis: Secondary | ICD-10-CM | POA: Diagnosis not present

## 2019-08-15 DIAGNOSIS — I129 Hypertensive chronic kidney disease with stage 1 through stage 4 chronic kidney disease, or unspecified chronic kidney disease: Secondary | ICD-10-CM | POA: Diagnosis not present

## 2019-08-18 DIAGNOSIS — I129 Hypertensive chronic kidney disease with stage 1 through stage 4 chronic kidney disease, or unspecified chronic kidney disease: Secondary | ICD-10-CM | POA: Diagnosis not present

## 2019-08-18 DIAGNOSIS — E1151 Type 2 diabetes mellitus with diabetic peripheral angiopathy without gangrene: Secondary | ICD-10-CM | POA: Diagnosis not present

## 2019-08-18 DIAGNOSIS — L853 Xerosis cutis: Secondary | ICD-10-CM | POA: Diagnosis not present

## 2019-08-18 DIAGNOSIS — Z4800 Encounter for change or removal of nonsurgical wound dressing: Secondary | ICD-10-CM | POA: Diagnosis not present

## 2019-08-18 DIAGNOSIS — E11622 Type 2 diabetes mellitus with other skin ulcer: Secondary | ICD-10-CM | POA: Diagnosis not present

## 2019-08-18 DIAGNOSIS — L97211 Non-pressure chronic ulcer of right calf limited to breakdown of skin: Secondary | ICD-10-CM | POA: Diagnosis not present

## 2019-08-22 DIAGNOSIS — L97211 Non-pressure chronic ulcer of right calf limited to breakdown of skin: Secondary | ICD-10-CM | POA: Diagnosis not present

## 2019-08-22 DIAGNOSIS — I129 Hypertensive chronic kidney disease with stage 1 through stage 4 chronic kidney disease, or unspecified chronic kidney disease: Secondary | ICD-10-CM | POA: Diagnosis not present

## 2019-08-22 DIAGNOSIS — E11622 Type 2 diabetes mellitus with other skin ulcer: Secondary | ICD-10-CM | POA: Diagnosis not present

## 2019-08-22 DIAGNOSIS — E1151 Type 2 diabetes mellitus with diabetic peripheral angiopathy without gangrene: Secondary | ICD-10-CM | POA: Diagnosis not present

## 2019-08-22 DIAGNOSIS — L853 Xerosis cutis: Secondary | ICD-10-CM | POA: Diagnosis not present

## 2019-08-22 DIAGNOSIS — Z4800 Encounter for change or removal of nonsurgical wound dressing: Secondary | ICD-10-CM | POA: Diagnosis not present

## 2019-08-25 DIAGNOSIS — L853 Xerosis cutis: Secondary | ICD-10-CM | POA: Diagnosis not present

## 2019-08-25 DIAGNOSIS — E11622 Type 2 diabetes mellitus with other skin ulcer: Secondary | ICD-10-CM | POA: Diagnosis not present

## 2019-08-25 DIAGNOSIS — I129 Hypertensive chronic kidney disease with stage 1 through stage 4 chronic kidney disease, or unspecified chronic kidney disease: Secondary | ICD-10-CM | POA: Diagnosis not present

## 2019-08-25 DIAGNOSIS — Z4800 Encounter for change or removal of nonsurgical wound dressing: Secondary | ICD-10-CM | POA: Diagnosis not present

## 2019-08-25 DIAGNOSIS — E1151 Type 2 diabetes mellitus with diabetic peripheral angiopathy without gangrene: Secondary | ICD-10-CM | POA: Diagnosis not present

## 2019-08-25 DIAGNOSIS — L97211 Non-pressure chronic ulcer of right calf limited to breakdown of skin: Secondary | ICD-10-CM | POA: Diagnosis not present

## 2019-08-27 DIAGNOSIS — F419 Anxiety disorder, unspecified: Secondary | ICD-10-CM | POA: Diagnosis not present

## 2019-08-27 DIAGNOSIS — E11622 Type 2 diabetes mellitus with other skin ulcer: Secondary | ICD-10-CM | POA: Diagnosis not present

## 2019-08-27 DIAGNOSIS — Z4800 Encounter for change or removal of nonsurgical wound dressing: Secondary | ICD-10-CM | POA: Diagnosis not present

## 2019-08-27 DIAGNOSIS — I129 Hypertensive chronic kidney disease with stage 1 through stage 4 chronic kidney disease, or unspecified chronic kidney disease: Secondary | ICD-10-CM | POA: Diagnosis not present

## 2019-08-27 DIAGNOSIS — L97211 Non-pressure chronic ulcer of right calf limited to breakdown of skin: Secondary | ICD-10-CM | POA: Diagnosis not present

## 2019-08-27 DIAGNOSIS — E11319 Type 2 diabetes mellitus with unspecified diabetic retinopathy without macular edema: Secondary | ICD-10-CM | POA: Diagnosis not present

## 2019-08-27 DIAGNOSIS — N182 Chronic kidney disease, stage 2 (mild): Secondary | ICD-10-CM | POA: Diagnosis not present

## 2019-08-27 DIAGNOSIS — C61 Malignant neoplasm of prostate: Secondary | ICD-10-CM | POA: Diagnosis not present

## 2019-08-27 DIAGNOSIS — Z7982 Long term (current) use of aspirin: Secondary | ICD-10-CM | POA: Diagnosis not present

## 2019-08-27 DIAGNOSIS — E1122 Type 2 diabetes mellitus with diabetic chronic kidney disease: Secondary | ICD-10-CM | POA: Diagnosis not present

## 2019-08-27 DIAGNOSIS — D631 Anemia in chronic kidney disease: Secondary | ICD-10-CM | POA: Diagnosis not present

## 2019-08-27 DIAGNOSIS — Z794 Long term (current) use of insulin: Secondary | ICD-10-CM | POA: Diagnosis not present

## 2019-08-27 DIAGNOSIS — E1151 Type 2 diabetes mellitus with diabetic peripheral angiopathy without gangrene: Secondary | ICD-10-CM | POA: Diagnosis not present

## 2019-08-27 DIAGNOSIS — E039 Hypothyroidism, unspecified: Secondary | ICD-10-CM | POA: Diagnosis not present

## 2019-08-27 DIAGNOSIS — L853 Xerosis cutis: Secondary | ICD-10-CM | POA: Diagnosis not present

## 2019-08-29 DIAGNOSIS — L97211 Non-pressure chronic ulcer of right calf limited to breakdown of skin: Secondary | ICD-10-CM | POA: Diagnosis not present

## 2019-08-29 DIAGNOSIS — I129 Hypertensive chronic kidney disease with stage 1 through stage 4 chronic kidney disease, or unspecified chronic kidney disease: Secondary | ICD-10-CM | POA: Diagnosis not present

## 2019-08-29 DIAGNOSIS — L853 Xerosis cutis: Secondary | ICD-10-CM | POA: Diagnosis not present

## 2019-08-29 DIAGNOSIS — Z4800 Encounter for change or removal of nonsurgical wound dressing: Secondary | ICD-10-CM | POA: Diagnosis not present

## 2019-08-29 DIAGNOSIS — E1151 Type 2 diabetes mellitus with diabetic peripheral angiopathy without gangrene: Secondary | ICD-10-CM | POA: Diagnosis not present

## 2019-08-29 DIAGNOSIS — E11622 Type 2 diabetes mellitus with other skin ulcer: Secondary | ICD-10-CM | POA: Diagnosis not present

## 2019-08-30 ENCOUNTER — Ambulatory Visit: Payer: Medicare Other | Attending: Internal Medicine

## 2019-08-30 DIAGNOSIS — Z23 Encounter for immunization: Secondary | ICD-10-CM

## 2019-08-30 NOTE — Progress Notes (Signed)
   Covid-19 Vaccination Clinic  Name:  Matthew Parsons    MRN: [id number] DOB: 03-22-1927  08/30/2019  Matthew Parsons was observed post Covid-19 immunization for 15 minutes without incident. He was provided with Vaccine Information Sheet and instruction to access the V-Safe system.   Matthew Parsons was instructed to call 911 with any severe reactions post vaccine: Marland Kitchen Difficulty breathing  . Swelling of face and throat  . A fast heartbeat  . A bad rash all over body  . Dizziness and weakness   Immunizations Administered    Name Date Dose VIS Date Route   Pfizer COVID-19 Vaccine 08/30/2019  3:28 PM 0.3 mL 05/19/2019 Intramuscular   Manufacturer: [place]   Lot: [id number]   [place]: [id number]

## 2019-09-01 DIAGNOSIS — E11622 Type 2 diabetes mellitus with other skin ulcer: Secondary | ICD-10-CM | POA: Diagnosis not present

## 2019-09-01 DIAGNOSIS — E1151 Type 2 diabetes mellitus with diabetic peripheral angiopathy without gangrene: Secondary | ICD-10-CM | POA: Diagnosis not present

## 2019-09-01 DIAGNOSIS — L97211 Non-pressure chronic ulcer of right calf limited to breakdown of skin: Secondary | ICD-10-CM | POA: Diagnosis not present

## 2019-09-01 DIAGNOSIS — Z4800 Encounter for change or removal of nonsurgical wound dressing: Secondary | ICD-10-CM | POA: Diagnosis not present

## 2019-09-01 DIAGNOSIS — I129 Hypertensive chronic kidney disease with stage 1 through stage 4 chronic kidney disease, or unspecified chronic kidney disease: Secondary | ICD-10-CM | POA: Diagnosis not present

## 2019-09-01 DIAGNOSIS — L853 Xerosis cutis: Secondary | ICD-10-CM | POA: Diagnosis not present

## 2019-09-05 DIAGNOSIS — I129 Hypertensive chronic kidney disease with stage 1 through stage 4 chronic kidney disease, or unspecified chronic kidney disease: Secondary | ICD-10-CM | POA: Diagnosis not present

## 2019-09-05 DIAGNOSIS — Z4800 Encounter for change or removal of nonsurgical wound dressing: Secondary | ICD-10-CM | POA: Diagnosis not present

## 2019-09-05 DIAGNOSIS — L853 Xerosis cutis: Secondary | ICD-10-CM | POA: Diagnosis not present

## 2019-09-05 DIAGNOSIS — E11622 Type 2 diabetes mellitus with other skin ulcer: Secondary | ICD-10-CM | POA: Diagnosis not present

## 2019-09-05 DIAGNOSIS — E1151 Type 2 diabetes mellitus with diabetic peripheral angiopathy without gangrene: Secondary | ICD-10-CM | POA: Diagnosis not present

## 2019-09-05 DIAGNOSIS — L97211 Non-pressure chronic ulcer of right calf limited to breakdown of skin: Secondary | ICD-10-CM | POA: Diagnosis not present

## 2019-09-06 DIAGNOSIS — N138 Other obstructive and reflux uropathy: Secondary | ICD-10-CM | POA: Diagnosis not present

## 2019-09-08 DIAGNOSIS — Z4800 Encounter for change or removal of nonsurgical wound dressing: Secondary | ICD-10-CM | POA: Diagnosis not present

## 2019-09-08 DIAGNOSIS — L853 Xerosis cutis: Secondary | ICD-10-CM | POA: Diagnosis not present

## 2019-09-08 DIAGNOSIS — I129 Hypertensive chronic kidney disease with stage 1 through stage 4 chronic kidney disease, or unspecified chronic kidney disease: Secondary | ICD-10-CM | POA: Diagnosis not present

## 2019-09-08 DIAGNOSIS — E1151 Type 2 diabetes mellitus with diabetic peripheral angiopathy without gangrene: Secondary | ICD-10-CM | POA: Diagnosis not present

## 2019-09-08 DIAGNOSIS — E11622 Type 2 diabetes mellitus with other skin ulcer: Secondary | ICD-10-CM | POA: Diagnosis not present

## 2019-09-08 DIAGNOSIS — L97211 Non-pressure chronic ulcer of right calf limited to breakdown of skin: Secondary | ICD-10-CM | POA: Diagnosis not present

## 2019-09-12 DIAGNOSIS — E11622 Type 2 diabetes mellitus with other skin ulcer: Secondary | ICD-10-CM | POA: Diagnosis not present

## 2019-09-12 DIAGNOSIS — Z4800 Encounter for change or removal of nonsurgical wound dressing: Secondary | ICD-10-CM | POA: Diagnosis not present

## 2019-09-12 DIAGNOSIS — L853 Xerosis cutis: Secondary | ICD-10-CM | POA: Diagnosis not present

## 2019-09-12 DIAGNOSIS — E1151 Type 2 diabetes mellitus with diabetic peripheral angiopathy without gangrene: Secondary | ICD-10-CM | POA: Diagnosis not present

## 2019-09-12 DIAGNOSIS — I129 Hypertensive chronic kidney disease with stage 1 through stage 4 chronic kidney disease, or unspecified chronic kidney disease: Secondary | ICD-10-CM | POA: Diagnosis not present

## 2019-09-12 DIAGNOSIS — L97211 Non-pressure chronic ulcer of right calf limited to breakdown of skin: Secondary | ICD-10-CM | POA: Diagnosis not present

## 2019-09-15 DIAGNOSIS — E11622 Type 2 diabetes mellitus with other skin ulcer: Secondary | ICD-10-CM | POA: Diagnosis not present

## 2019-09-15 DIAGNOSIS — E1151 Type 2 diabetes mellitus with diabetic peripheral angiopathy without gangrene: Secondary | ICD-10-CM | POA: Diagnosis not present

## 2019-09-15 DIAGNOSIS — Z4800 Encounter for change or removal of nonsurgical wound dressing: Secondary | ICD-10-CM | POA: Diagnosis not present

## 2019-09-15 DIAGNOSIS — L97211 Non-pressure chronic ulcer of right calf limited to breakdown of skin: Secondary | ICD-10-CM | POA: Diagnosis not present

## 2019-09-15 DIAGNOSIS — I129 Hypertensive chronic kidney disease with stage 1 through stage 4 chronic kidney disease, or unspecified chronic kidney disease: Secondary | ICD-10-CM | POA: Diagnosis not present

## 2019-09-15 DIAGNOSIS — L853 Xerosis cutis: Secondary | ICD-10-CM | POA: Diagnosis not present

## 2019-09-19 DIAGNOSIS — I129 Hypertensive chronic kidney disease with stage 1 through stage 4 chronic kidney disease, or unspecified chronic kidney disease: Secondary | ICD-10-CM | POA: Diagnosis not present

## 2019-09-19 DIAGNOSIS — E11622 Type 2 diabetes mellitus with other skin ulcer: Secondary | ICD-10-CM | POA: Diagnosis not present

## 2019-09-19 DIAGNOSIS — Z4800 Encounter for change or removal of nonsurgical wound dressing: Secondary | ICD-10-CM | POA: Diagnosis not present

## 2019-09-19 DIAGNOSIS — E1151 Type 2 diabetes mellitus with diabetic peripheral angiopathy without gangrene: Secondary | ICD-10-CM | POA: Diagnosis not present

## 2019-09-19 DIAGNOSIS — L97211 Non-pressure chronic ulcer of right calf limited to breakdown of skin: Secondary | ICD-10-CM | POA: Diagnosis not present

## 2019-09-19 DIAGNOSIS — L853 Xerosis cutis: Secondary | ICD-10-CM | POA: Diagnosis not present

## 2019-09-23 DIAGNOSIS — L853 Xerosis cutis: Secondary | ICD-10-CM | POA: Diagnosis not present

## 2019-09-23 DIAGNOSIS — E1151 Type 2 diabetes mellitus with diabetic peripheral angiopathy without gangrene: Secondary | ICD-10-CM | POA: Diagnosis not present

## 2019-09-23 DIAGNOSIS — E11622 Type 2 diabetes mellitus with other skin ulcer: Secondary | ICD-10-CM | POA: Diagnosis not present

## 2019-09-23 DIAGNOSIS — Z4800 Encounter for change or removal of nonsurgical wound dressing: Secondary | ICD-10-CM | POA: Diagnosis not present

## 2019-09-23 DIAGNOSIS — L97211 Non-pressure chronic ulcer of right calf limited to breakdown of skin: Secondary | ICD-10-CM | POA: Diagnosis not present

## 2019-09-23 DIAGNOSIS — I129 Hypertensive chronic kidney disease with stage 1 through stage 4 chronic kidney disease, or unspecified chronic kidney disease: Secondary | ICD-10-CM | POA: Diagnosis not present

## 2019-09-26 DIAGNOSIS — N182 Chronic kidney disease, stage 2 (mild): Secondary | ICD-10-CM | POA: Diagnosis not present

## 2019-09-26 DIAGNOSIS — C61 Malignant neoplasm of prostate: Secondary | ICD-10-CM | POA: Diagnosis not present

## 2019-09-26 DIAGNOSIS — I129 Hypertensive chronic kidney disease with stage 1 through stage 4 chronic kidney disease, or unspecified chronic kidney disease: Secondary | ICD-10-CM | POA: Diagnosis not present

## 2019-09-26 DIAGNOSIS — Z7982 Long term (current) use of aspirin: Secondary | ICD-10-CM | POA: Diagnosis not present

## 2019-09-26 DIAGNOSIS — E1151 Type 2 diabetes mellitus with diabetic peripheral angiopathy without gangrene: Secondary | ICD-10-CM | POA: Diagnosis not present

## 2019-09-26 DIAGNOSIS — Z794 Long term (current) use of insulin: Secondary | ICD-10-CM | POA: Diagnosis not present

## 2019-09-26 DIAGNOSIS — E11622 Type 2 diabetes mellitus with other skin ulcer: Secondary | ICD-10-CM | POA: Diagnosis not present

## 2019-09-26 DIAGNOSIS — E11319 Type 2 diabetes mellitus with unspecified diabetic retinopathy without macular edema: Secondary | ICD-10-CM | POA: Diagnosis not present

## 2019-09-26 DIAGNOSIS — L853 Xerosis cutis: Secondary | ICD-10-CM | POA: Diagnosis not present

## 2019-09-26 DIAGNOSIS — D631 Anemia in chronic kidney disease: Secondary | ICD-10-CM | POA: Diagnosis not present

## 2019-09-26 DIAGNOSIS — E1122 Type 2 diabetes mellitus with diabetic chronic kidney disease: Secondary | ICD-10-CM | POA: Diagnosis not present

## 2019-09-26 DIAGNOSIS — E039 Hypothyroidism, unspecified: Secondary | ICD-10-CM | POA: Diagnosis not present

## 2019-10-11 DIAGNOSIS — E1149 Type 2 diabetes mellitus with other diabetic neurological complication: Secondary | ICD-10-CM | POA: Diagnosis not present

## 2019-10-11 DIAGNOSIS — E46 Unspecified protein-calorie malnutrition: Secondary | ICD-10-CM | POA: Diagnosis not present

## 2019-10-11 DIAGNOSIS — R809 Proteinuria, unspecified: Secondary | ICD-10-CM | POA: Diagnosis not present

## 2019-10-11 DIAGNOSIS — I739 Peripheral vascular disease, unspecified: Secondary | ICD-10-CM | POA: Diagnosis not present

## 2019-10-11 DIAGNOSIS — E038 Other specified hypothyroidism: Secondary | ICD-10-CM | POA: Diagnosis not present

## 2019-10-11 DIAGNOSIS — E1151 Type 2 diabetes mellitus with diabetic peripheral angiopathy without gangrene: Secondary | ICD-10-CM | POA: Diagnosis not present

## 2019-10-11 DIAGNOSIS — N401 Enlarged prostate with lower urinary tract symptoms: Secondary | ICD-10-CM | POA: Diagnosis not present

## 2019-10-11 DIAGNOSIS — C61 Malignant neoplasm of prostate: Secondary | ICD-10-CM | POA: Diagnosis not present

## 2019-10-11 DIAGNOSIS — N182 Chronic kidney disease, stage 2 (mild): Secondary | ICD-10-CM | POA: Diagnosis not present

## 2019-10-11 DIAGNOSIS — E7800 Pure hypercholesterolemia, unspecified: Secondary | ICD-10-CM | POA: Diagnosis not present

## 2019-10-11 DIAGNOSIS — Z794 Long term (current) use of insulin: Secondary | ICD-10-CM | POA: Diagnosis not present

## 2019-10-11 DIAGNOSIS — I129 Hypertensive chronic kidney disease with stage 1 through stage 4 chronic kidney disease, or unspecified chronic kidney disease: Secondary | ICD-10-CM | POA: Diagnosis not present

## 2019-10-12 ENCOUNTER — Other Ambulatory Visit: Payer: Self-pay

## 2019-10-12 ENCOUNTER — Ambulatory Visit (HOSPITAL_COMMUNITY)
Admission: RE | Admit: 2019-10-12 | Discharge: 2019-10-12 | Disposition: A | Discharge status: Home / Self Care | Payer: Medicare Other | Source: Ambulatory Visit | Attending: Surgery | Admitting: Surgery

## 2019-10-12 ENCOUNTER — Other Ambulatory Visit (HOSPITAL_COMMUNITY): Payer: Self-pay | Admitting: Internal Medicine

## 2019-10-12 DIAGNOSIS — R600 Localized edema: Secondary | ICD-10-CM | POA: Diagnosis not present

## 2019-10-26 DIAGNOSIS — E1151 Type 2 diabetes mellitus with diabetic peripheral angiopathy without gangrene: Secondary | ICD-10-CM | POA: Diagnosis not present

## 2019-11-25 DIAGNOSIS — I129 Hypertensive chronic kidney disease with stage 1 through stage 4 chronic kidney disease, or unspecified chronic kidney disease: Secondary | ICD-10-CM | POA: Diagnosis not present

## 2019-11-25 DIAGNOSIS — E1151 Type 2 diabetes mellitus with diabetic peripheral angiopathy without gangrene: Secondary | ICD-10-CM | POA: Diagnosis not present

## 2019-11-25 DIAGNOSIS — E11319 Type 2 diabetes mellitus with unspecified diabetic retinopathy without macular edema: Secondary | ICD-10-CM | POA: Diagnosis not present

## 2019-11-25 DIAGNOSIS — E1122 Type 2 diabetes mellitus with diabetic chronic kidney disease: Secondary | ICD-10-CM | POA: Diagnosis not present

## 2019-11-25 DIAGNOSIS — N182 Chronic kidney disease, stage 2 (mild): Secondary | ICD-10-CM | POA: Diagnosis not present

## 2019-11-25 DIAGNOSIS — Z794 Long term (current) use of insulin: Secondary | ICD-10-CM | POA: Diagnosis not present

## 2019-11-25 DIAGNOSIS — D631 Anemia in chronic kidney disease: Secondary | ICD-10-CM | POA: Diagnosis not present

## 2019-11-25 DIAGNOSIS — Z7982 Long term (current) use of aspirin: Secondary | ICD-10-CM | POA: Diagnosis not present

## 2019-11-25 DIAGNOSIS — E039 Hypothyroidism, unspecified: Secondary | ICD-10-CM | POA: Diagnosis not present

## 2019-11-25 DIAGNOSIS — C61 Malignant neoplasm of prostate: Secondary | ICD-10-CM | POA: Diagnosis not present

## 2019-11-25 DIAGNOSIS — L853 Xerosis cutis: Secondary | ICD-10-CM | POA: Diagnosis not present

## 2019-11-28 DIAGNOSIS — L853 Xerosis cutis: Secondary | ICD-10-CM | POA: Diagnosis not present

## 2019-11-28 DIAGNOSIS — D631 Anemia in chronic kidney disease: Secondary | ICD-10-CM | POA: Diagnosis not present

## 2019-11-28 DIAGNOSIS — E1122 Type 2 diabetes mellitus with diabetic chronic kidney disease: Secondary | ICD-10-CM | POA: Diagnosis not present

## 2019-11-28 DIAGNOSIS — N182 Chronic kidney disease, stage 2 (mild): Secondary | ICD-10-CM | POA: Diagnosis not present

## 2019-11-28 DIAGNOSIS — E1151 Type 2 diabetes mellitus with diabetic peripheral angiopathy without gangrene: Secondary | ICD-10-CM | POA: Diagnosis not present

## 2019-11-28 DIAGNOSIS — I129 Hypertensive chronic kidney disease with stage 1 through stage 4 chronic kidney disease, or unspecified chronic kidney disease: Secondary | ICD-10-CM | POA: Diagnosis not present

## 2019-12-01 DIAGNOSIS — E1122 Type 2 diabetes mellitus with diabetic chronic kidney disease: Secondary | ICD-10-CM | POA: Diagnosis not present

## 2019-12-01 DIAGNOSIS — I129 Hypertensive chronic kidney disease with stage 1 through stage 4 chronic kidney disease, or unspecified chronic kidney disease: Secondary | ICD-10-CM | POA: Diagnosis not present

## 2019-12-01 DIAGNOSIS — L853 Xerosis cutis: Secondary | ICD-10-CM | POA: Diagnosis not present

## 2019-12-01 DIAGNOSIS — D631 Anemia in chronic kidney disease: Secondary | ICD-10-CM | POA: Diagnosis not present

## 2019-12-01 DIAGNOSIS — N182 Chronic kidney disease, stage 2 (mild): Secondary | ICD-10-CM | POA: Diagnosis not present

## 2019-12-01 DIAGNOSIS — E1151 Type 2 diabetes mellitus with diabetic peripheral angiopathy without gangrene: Secondary | ICD-10-CM | POA: Diagnosis not present

## 2019-12-05 DIAGNOSIS — N182 Chronic kidney disease, stage 2 (mild): Secondary | ICD-10-CM | POA: Diagnosis not present

## 2019-12-05 DIAGNOSIS — L853 Xerosis cutis: Secondary | ICD-10-CM | POA: Diagnosis not present

## 2019-12-05 DIAGNOSIS — E1122 Type 2 diabetes mellitus with diabetic chronic kidney disease: Secondary | ICD-10-CM | POA: Diagnosis not present

## 2019-12-05 DIAGNOSIS — E1151 Type 2 diabetes mellitus with diabetic peripheral angiopathy without gangrene: Secondary | ICD-10-CM | POA: Diagnosis not present

## 2019-12-05 DIAGNOSIS — D631 Anemia in chronic kidney disease: Secondary | ICD-10-CM | POA: Diagnosis not present

## 2019-12-05 DIAGNOSIS — I129 Hypertensive chronic kidney disease with stage 1 through stage 4 chronic kidney disease, or unspecified chronic kidney disease: Secondary | ICD-10-CM | POA: Diagnosis not present

## 2019-12-08 DIAGNOSIS — D631 Anemia in chronic kidney disease: Secondary | ICD-10-CM | POA: Diagnosis not present

## 2019-12-08 DIAGNOSIS — E1122 Type 2 diabetes mellitus with diabetic chronic kidney disease: Secondary | ICD-10-CM | POA: Diagnosis not present

## 2019-12-08 DIAGNOSIS — I129 Hypertensive chronic kidney disease with stage 1 through stage 4 chronic kidney disease, or unspecified chronic kidney disease: Secondary | ICD-10-CM | POA: Diagnosis not present

## 2019-12-08 DIAGNOSIS — N182 Chronic kidney disease, stage 2 (mild): Secondary | ICD-10-CM | POA: Diagnosis not present

## 2019-12-08 DIAGNOSIS — L853 Xerosis cutis: Secondary | ICD-10-CM | POA: Diagnosis not present

## 2019-12-08 DIAGNOSIS — E1151 Type 2 diabetes mellitus with diabetic peripheral angiopathy without gangrene: Secondary | ICD-10-CM | POA: Diagnosis not present

## 2019-12-11 DIAGNOSIS — N182 Chronic kidney disease, stage 2 (mild): Secondary | ICD-10-CM | POA: Diagnosis not present

## 2019-12-11 DIAGNOSIS — E1151 Type 2 diabetes mellitus with diabetic peripheral angiopathy without gangrene: Secondary | ICD-10-CM | POA: Diagnosis not present

## 2019-12-11 DIAGNOSIS — E1122 Type 2 diabetes mellitus with diabetic chronic kidney disease: Secondary | ICD-10-CM | POA: Diagnosis not present

## 2019-12-11 DIAGNOSIS — I129 Hypertensive chronic kidney disease with stage 1 through stage 4 chronic kidney disease, or unspecified chronic kidney disease: Secondary | ICD-10-CM | POA: Diagnosis not present

## 2019-12-11 DIAGNOSIS — D631 Anemia in chronic kidney disease: Secondary | ICD-10-CM | POA: Diagnosis not present

## 2019-12-11 DIAGNOSIS — L853 Xerosis cutis: Secondary | ICD-10-CM | POA: Diagnosis not present

## 2019-12-18 DIAGNOSIS — I129 Hypertensive chronic kidney disease with stage 1 through stage 4 chronic kidney disease, or unspecified chronic kidney disease: Secondary | ICD-10-CM | POA: Diagnosis not present

## 2019-12-18 DIAGNOSIS — L853 Xerosis cutis: Secondary | ICD-10-CM | POA: Diagnosis not present

## 2019-12-18 DIAGNOSIS — E1122 Type 2 diabetes mellitus with diabetic chronic kidney disease: Secondary | ICD-10-CM | POA: Diagnosis not present

## 2019-12-18 DIAGNOSIS — E1151 Type 2 diabetes mellitus with diabetic peripheral angiopathy without gangrene: Secondary | ICD-10-CM | POA: Diagnosis not present

## 2019-12-18 DIAGNOSIS — D631 Anemia in chronic kidney disease: Secondary | ICD-10-CM | POA: Diagnosis not present

## 2019-12-18 DIAGNOSIS — N182 Chronic kidney disease, stage 2 (mild): Secondary | ICD-10-CM | POA: Diagnosis not present

## 2019-12-20 DIAGNOSIS — I129 Hypertensive chronic kidney disease with stage 1 through stage 4 chronic kidney disease, or unspecified chronic kidney disease: Secondary | ICD-10-CM | POA: Diagnosis not present

## 2019-12-20 DIAGNOSIS — E1122 Type 2 diabetes mellitus with diabetic chronic kidney disease: Secondary | ICD-10-CM | POA: Diagnosis not present

## 2019-12-20 DIAGNOSIS — N182 Chronic kidney disease, stage 2 (mild): Secondary | ICD-10-CM | POA: Diagnosis not present

## 2019-12-20 DIAGNOSIS — D631 Anemia in chronic kidney disease: Secondary | ICD-10-CM | POA: Diagnosis not present

## 2019-12-20 DIAGNOSIS — E1151 Type 2 diabetes mellitus with diabetic peripheral angiopathy without gangrene: Secondary | ICD-10-CM | POA: Diagnosis not present

## 2019-12-20 DIAGNOSIS — L853 Xerosis cutis: Secondary | ICD-10-CM | POA: Diagnosis not present

## 2019-12-22 DIAGNOSIS — I129 Hypertensive chronic kidney disease with stage 1 through stage 4 chronic kidney disease, or unspecified chronic kidney disease: Secondary | ICD-10-CM | POA: Diagnosis not present

## 2019-12-22 DIAGNOSIS — E1122 Type 2 diabetes mellitus with diabetic chronic kidney disease: Secondary | ICD-10-CM | POA: Diagnosis not present

## 2019-12-22 DIAGNOSIS — L853 Xerosis cutis: Secondary | ICD-10-CM | POA: Diagnosis not present

## 2019-12-22 DIAGNOSIS — E1151 Type 2 diabetes mellitus with diabetic peripheral angiopathy without gangrene: Secondary | ICD-10-CM | POA: Diagnosis not present

## 2019-12-22 DIAGNOSIS — N182 Chronic kidney disease, stage 2 (mild): Secondary | ICD-10-CM | POA: Diagnosis not present

## 2019-12-22 DIAGNOSIS — D631 Anemia in chronic kidney disease: Secondary | ICD-10-CM | POA: Diagnosis not present

## 2019-12-25 DIAGNOSIS — L853 Xerosis cutis: Secondary | ICD-10-CM | POA: Diagnosis not present

## 2019-12-25 DIAGNOSIS — Z7982 Long term (current) use of aspirin: Secondary | ICD-10-CM | POA: Diagnosis not present

## 2019-12-25 DIAGNOSIS — E039 Hypothyroidism, unspecified: Secondary | ICD-10-CM | POA: Diagnosis not present

## 2019-12-25 DIAGNOSIS — Z794 Long term (current) use of insulin: Secondary | ICD-10-CM | POA: Diagnosis not present

## 2019-12-25 DIAGNOSIS — C61 Malignant neoplasm of prostate: Secondary | ICD-10-CM | POA: Diagnosis not present

## 2019-12-25 DIAGNOSIS — E1151 Type 2 diabetes mellitus with diabetic peripheral angiopathy without gangrene: Secondary | ICD-10-CM | POA: Diagnosis not present

## 2019-12-25 DIAGNOSIS — D631 Anemia in chronic kidney disease: Secondary | ICD-10-CM | POA: Diagnosis not present

## 2019-12-25 DIAGNOSIS — E1122 Type 2 diabetes mellitus with diabetic chronic kidney disease: Secondary | ICD-10-CM | POA: Diagnosis not present

## 2019-12-25 DIAGNOSIS — E11319 Type 2 diabetes mellitus with unspecified diabetic retinopathy without macular edema: Secondary | ICD-10-CM | POA: Diagnosis not present

## 2019-12-25 DIAGNOSIS — I129 Hypertensive chronic kidney disease with stage 1 through stage 4 chronic kidney disease, or unspecified chronic kidney disease: Secondary | ICD-10-CM | POA: Diagnosis not present

## 2019-12-25 DIAGNOSIS — N182 Chronic kidney disease, stage 2 (mild): Secondary | ICD-10-CM | POA: Diagnosis not present

## 2019-12-27 DIAGNOSIS — N182 Chronic kidney disease, stage 2 (mild): Secondary | ICD-10-CM | POA: Diagnosis not present

## 2019-12-27 DIAGNOSIS — E1122 Type 2 diabetes mellitus with diabetic chronic kidney disease: Secondary | ICD-10-CM | POA: Diagnosis not present

## 2019-12-27 DIAGNOSIS — I129 Hypertensive chronic kidney disease with stage 1 through stage 4 chronic kidney disease, or unspecified chronic kidney disease: Secondary | ICD-10-CM | POA: Diagnosis not present

## 2019-12-27 DIAGNOSIS — E1151 Type 2 diabetes mellitus with diabetic peripheral angiopathy without gangrene: Secondary | ICD-10-CM | POA: Diagnosis not present

## 2019-12-27 DIAGNOSIS — D631 Anemia in chronic kidney disease: Secondary | ICD-10-CM | POA: Diagnosis not present

## 2019-12-27 DIAGNOSIS — L853 Xerosis cutis: Secondary | ICD-10-CM | POA: Diagnosis not present

## 2019-12-29 DIAGNOSIS — N182 Chronic kidney disease, stage 2 (mild): Secondary | ICD-10-CM | POA: Diagnosis not present

## 2019-12-29 DIAGNOSIS — L853 Xerosis cutis: Secondary | ICD-10-CM | POA: Diagnosis not present

## 2019-12-29 DIAGNOSIS — E1151 Type 2 diabetes mellitus with diabetic peripheral angiopathy without gangrene: Secondary | ICD-10-CM | POA: Diagnosis not present

## 2019-12-29 DIAGNOSIS — E1122 Type 2 diabetes mellitus with diabetic chronic kidney disease: Secondary | ICD-10-CM | POA: Diagnosis not present

## 2019-12-29 DIAGNOSIS — D631 Anemia in chronic kidney disease: Secondary | ICD-10-CM | POA: Diagnosis not present

## 2019-12-29 DIAGNOSIS — I129 Hypertensive chronic kidney disease with stage 1 through stage 4 chronic kidney disease, or unspecified chronic kidney disease: Secondary | ICD-10-CM | POA: Diagnosis not present

## 2020-01-02 DIAGNOSIS — L853 Xerosis cutis: Secondary | ICD-10-CM | POA: Diagnosis not present

## 2020-01-02 DIAGNOSIS — D631 Anemia in chronic kidney disease: Secondary | ICD-10-CM | POA: Diagnosis not present

## 2020-01-02 DIAGNOSIS — I129 Hypertensive chronic kidney disease with stage 1 through stage 4 chronic kidney disease, or unspecified chronic kidney disease: Secondary | ICD-10-CM | POA: Diagnosis not present

## 2020-01-02 DIAGNOSIS — E1151 Type 2 diabetes mellitus with diabetic peripheral angiopathy without gangrene: Secondary | ICD-10-CM | POA: Diagnosis not present

## 2020-01-02 DIAGNOSIS — E1122 Type 2 diabetes mellitus with diabetic chronic kidney disease: Secondary | ICD-10-CM | POA: Diagnosis not present

## 2020-01-02 DIAGNOSIS — N182 Chronic kidney disease, stage 2 (mild): Secondary | ICD-10-CM | POA: Diagnosis not present

## 2020-01-05 DIAGNOSIS — E1122 Type 2 diabetes mellitus with diabetic chronic kidney disease: Secondary | ICD-10-CM | POA: Diagnosis not present

## 2020-01-05 DIAGNOSIS — D631 Anemia in chronic kidney disease: Secondary | ICD-10-CM | POA: Diagnosis not present

## 2020-01-05 DIAGNOSIS — L853 Xerosis cutis: Secondary | ICD-10-CM | POA: Diagnosis not present

## 2020-01-05 DIAGNOSIS — I129 Hypertensive chronic kidney disease with stage 1 through stage 4 chronic kidney disease, or unspecified chronic kidney disease: Secondary | ICD-10-CM | POA: Diagnosis not present

## 2020-01-05 DIAGNOSIS — E1151 Type 2 diabetes mellitus with diabetic peripheral angiopathy without gangrene: Secondary | ICD-10-CM | POA: Diagnosis not present

## 2020-01-05 DIAGNOSIS — N182 Chronic kidney disease, stage 2 (mild): Secondary | ICD-10-CM | POA: Diagnosis not present

## 2020-01-08 DIAGNOSIS — L309 Dermatitis, unspecified: Secondary | ICD-10-CM | POA: Diagnosis not present

## 2020-01-09 DIAGNOSIS — E1122 Type 2 diabetes mellitus with diabetic chronic kidney disease: Secondary | ICD-10-CM | POA: Diagnosis not present

## 2020-01-09 DIAGNOSIS — L853 Xerosis cutis: Secondary | ICD-10-CM | POA: Diagnosis not present

## 2020-01-09 DIAGNOSIS — I129 Hypertensive chronic kidney disease with stage 1 through stage 4 chronic kidney disease, or unspecified chronic kidney disease: Secondary | ICD-10-CM | POA: Diagnosis not present

## 2020-01-09 DIAGNOSIS — N182 Chronic kidney disease, stage 2 (mild): Secondary | ICD-10-CM | POA: Diagnosis not present

## 2020-01-09 DIAGNOSIS — D631 Anemia in chronic kidney disease: Secondary | ICD-10-CM | POA: Diagnosis not present

## 2020-01-09 DIAGNOSIS — E1151 Type 2 diabetes mellitus with diabetic peripheral angiopathy without gangrene: Secondary | ICD-10-CM | POA: Diagnosis not present

## 2020-01-12 DIAGNOSIS — E1151 Type 2 diabetes mellitus with diabetic peripheral angiopathy without gangrene: Secondary | ICD-10-CM | POA: Diagnosis not present

## 2020-01-12 DIAGNOSIS — E1122 Type 2 diabetes mellitus with diabetic chronic kidney disease: Secondary | ICD-10-CM | POA: Diagnosis not present

## 2020-01-12 DIAGNOSIS — D631 Anemia in chronic kidney disease: Secondary | ICD-10-CM | POA: Diagnosis not present

## 2020-01-12 DIAGNOSIS — L853 Xerosis cutis: Secondary | ICD-10-CM | POA: Diagnosis not present

## 2020-01-12 DIAGNOSIS — I129 Hypertensive chronic kidney disease with stage 1 through stage 4 chronic kidney disease, or unspecified chronic kidney disease: Secondary | ICD-10-CM | POA: Diagnosis not present

## 2020-01-12 DIAGNOSIS — N182 Chronic kidney disease, stage 2 (mild): Secondary | ICD-10-CM | POA: Diagnosis not present

## 2020-01-16 DIAGNOSIS — E1122 Type 2 diabetes mellitus with diabetic chronic kidney disease: Secondary | ICD-10-CM | POA: Diagnosis not present

## 2020-01-16 DIAGNOSIS — E1151 Type 2 diabetes mellitus with diabetic peripheral angiopathy without gangrene: Secondary | ICD-10-CM | POA: Diagnosis not present

## 2020-01-16 DIAGNOSIS — I129 Hypertensive chronic kidney disease with stage 1 through stage 4 chronic kidney disease, or unspecified chronic kidney disease: Secondary | ICD-10-CM | POA: Diagnosis not present

## 2020-01-16 DIAGNOSIS — L853 Xerosis cutis: Secondary | ICD-10-CM | POA: Diagnosis not present

## 2020-01-16 DIAGNOSIS — D631 Anemia in chronic kidney disease: Secondary | ICD-10-CM | POA: Diagnosis not present

## 2020-01-16 DIAGNOSIS — N182 Chronic kidney disease, stage 2 (mild): Secondary | ICD-10-CM | POA: Diagnosis not present

## 2020-01-19 DIAGNOSIS — D631 Anemia in chronic kidney disease: Secondary | ICD-10-CM | POA: Diagnosis not present

## 2020-01-19 DIAGNOSIS — I129 Hypertensive chronic kidney disease with stage 1 through stage 4 chronic kidney disease, or unspecified chronic kidney disease: Secondary | ICD-10-CM | POA: Diagnosis not present

## 2020-01-19 DIAGNOSIS — E1151 Type 2 diabetes mellitus with diabetic peripheral angiopathy without gangrene: Secondary | ICD-10-CM | POA: Diagnosis not present

## 2020-01-19 DIAGNOSIS — L853 Xerosis cutis: Secondary | ICD-10-CM | POA: Diagnosis not present

## 2020-01-19 DIAGNOSIS — E1122 Type 2 diabetes mellitus with diabetic chronic kidney disease: Secondary | ICD-10-CM | POA: Diagnosis not present

## 2020-01-19 DIAGNOSIS — N182 Chronic kidney disease, stage 2 (mild): Secondary | ICD-10-CM | POA: Diagnosis not present

## 2020-01-23 DIAGNOSIS — I129 Hypertensive chronic kidney disease with stage 1 through stage 4 chronic kidney disease, or unspecified chronic kidney disease: Secondary | ICD-10-CM | POA: Diagnosis not present

## 2020-01-23 DIAGNOSIS — D631 Anemia in chronic kidney disease: Secondary | ICD-10-CM | POA: Diagnosis not present

## 2020-01-23 DIAGNOSIS — N182 Chronic kidney disease, stage 2 (mild): Secondary | ICD-10-CM | POA: Diagnosis not present

## 2020-01-23 DIAGNOSIS — E1151 Type 2 diabetes mellitus with diabetic peripheral angiopathy without gangrene: Secondary | ICD-10-CM | POA: Diagnosis not present

## 2020-01-23 DIAGNOSIS — E1122 Type 2 diabetes mellitus with diabetic chronic kidney disease: Secondary | ICD-10-CM | POA: Diagnosis not present

## 2020-01-23 DIAGNOSIS — L853 Xerosis cutis: Secondary | ICD-10-CM | POA: Diagnosis not present

## 2020-01-24 DIAGNOSIS — D631 Anemia in chronic kidney disease: Secondary | ICD-10-CM | POA: Diagnosis not present

## 2020-01-24 DIAGNOSIS — L97919 Non-pressure chronic ulcer of unspecified part of right lower leg with unspecified severity: Secondary | ICD-10-CM | POA: Diagnosis not present

## 2020-01-24 DIAGNOSIS — E1151 Type 2 diabetes mellitus with diabetic peripheral angiopathy without gangrene: Secondary | ICD-10-CM | POA: Diagnosis not present

## 2020-01-24 DIAGNOSIS — I739 Peripheral vascular disease, unspecified: Secondary | ICD-10-CM | POA: Diagnosis not present

## 2020-01-24 DIAGNOSIS — E039 Hypothyroidism, unspecified: Secondary | ICD-10-CM | POA: Diagnosis not present

## 2020-01-24 DIAGNOSIS — E1122 Type 2 diabetes mellitus with diabetic chronic kidney disease: Secondary | ICD-10-CM | POA: Diagnosis not present

## 2020-01-24 DIAGNOSIS — Z794 Long term (current) use of insulin: Secondary | ICD-10-CM | POA: Diagnosis not present

## 2020-01-24 DIAGNOSIS — E11319 Type 2 diabetes mellitus with unspecified diabetic retinopathy without macular edema: Secondary | ICD-10-CM | POA: Diagnosis not present

## 2020-01-24 DIAGNOSIS — N182 Chronic kidney disease, stage 2 (mild): Secondary | ICD-10-CM | POA: Diagnosis not present

## 2020-01-24 DIAGNOSIS — I129 Hypertensive chronic kidney disease with stage 1 through stage 4 chronic kidney disease, or unspecified chronic kidney disease: Secondary | ICD-10-CM | POA: Diagnosis not present

## 2020-01-24 DIAGNOSIS — C61 Malignant neoplasm of prostate: Secondary | ICD-10-CM | POA: Diagnosis not present

## 2020-01-24 DIAGNOSIS — Z7982 Long term (current) use of aspirin: Secondary | ICD-10-CM | POA: Diagnosis not present

## 2020-01-26 DIAGNOSIS — I129 Hypertensive chronic kidney disease with stage 1 through stage 4 chronic kidney disease, or unspecified chronic kidney disease: Secondary | ICD-10-CM | POA: Diagnosis not present

## 2020-01-26 DIAGNOSIS — N182 Chronic kidney disease, stage 2 (mild): Secondary | ICD-10-CM | POA: Diagnosis not present

## 2020-01-26 DIAGNOSIS — E1151 Type 2 diabetes mellitus with diabetic peripheral angiopathy without gangrene: Secondary | ICD-10-CM | POA: Diagnosis not present

## 2020-01-26 DIAGNOSIS — E1122 Type 2 diabetes mellitus with diabetic chronic kidney disease: Secondary | ICD-10-CM | POA: Diagnosis not present

## 2020-01-26 DIAGNOSIS — L97919 Non-pressure chronic ulcer of unspecified part of right lower leg with unspecified severity: Secondary | ICD-10-CM | POA: Diagnosis not present

## 2020-01-26 DIAGNOSIS — I739 Peripheral vascular disease, unspecified: Secondary | ICD-10-CM | POA: Diagnosis not present

## 2020-01-30 DIAGNOSIS — I739 Peripheral vascular disease, unspecified: Secondary | ICD-10-CM | POA: Diagnosis not present

## 2020-01-30 DIAGNOSIS — I129 Hypertensive chronic kidney disease with stage 1 through stage 4 chronic kidney disease, or unspecified chronic kidney disease: Secondary | ICD-10-CM | POA: Diagnosis not present

## 2020-01-30 DIAGNOSIS — N182 Chronic kidney disease, stage 2 (mild): Secondary | ICD-10-CM | POA: Diagnosis not present

## 2020-01-30 DIAGNOSIS — L97919 Non-pressure chronic ulcer of unspecified part of right lower leg with unspecified severity: Secondary | ICD-10-CM | POA: Diagnosis not present

## 2020-01-30 DIAGNOSIS — E1151 Type 2 diabetes mellitus with diabetic peripheral angiopathy without gangrene: Secondary | ICD-10-CM | POA: Diagnosis not present

## 2020-01-30 DIAGNOSIS — E1122 Type 2 diabetes mellitus with diabetic chronic kidney disease: Secondary | ICD-10-CM | POA: Diagnosis not present

## 2020-02-02 DIAGNOSIS — L97919 Non-pressure chronic ulcer of unspecified part of right lower leg with unspecified severity: Secondary | ICD-10-CM | POA: Diagnosis not present

## 2020-02-02 DIAGNOSIS — N182 Chronic kidney disease, stage 2 (mild): Secondary | ICD-10-CM | POA: Diagnosis not present

## 2020-02-02 DIAGNOSIS — E1122 Type 2 diabetes mellitus with diabetic chronic kidney disease: Secondary | ICD-10-CM | POA: Diagnosis not present

## 2020-02-02 DIAGNOSIS — I739 Peripheral vascular disease, unspecified: Secondary | ICD-10-CM | POA: Diagnosis not present

## 2020-02-02 DIAGNOSIS — E1151 Type 2 diabetes mellitus with diabetic peripheral angiopathy without gangrene: Secondary | ICD-10-CM | POA: Diagnosis not present

## 2020-02-02 DIAGNOSIS — I129 Hypertensive chronic kidney disease with stage 1 through stage 4 chronic kidney disease, or unspecified chronic kidney disease: Secondary | ICD-10-CM | POA: Diagnosis not present

## 2020-02-05 DIAGNOSIS — L131 Subcorneal pustular dermatitis: Secondary | ICD-10-CM | POA: Diagnosis not present

## 2020-02-05 DIAGNOSIS — L821 Other seborrheic keratosis: Secondary | ICD-10-CM | POA: Diagnosis not present

## 2020-02-05 DIAGNOSIS — D485 Neoplasm of uncertain behavior of skin: Secondary | ICD-10-CM | POA: Diagnosis not present

## 2020-02-06 DIAGNOSIS — N182 Chronic kidney disease, stage 2 (mild): Secondary | ICD-10-CM | POA: Diagnosis not present

## 2020-02-06 DIAGNOSIS — I129 Hypertensive chronic kidney disease with stage 1 through stage 4 chronic kidney disease, or unspecified chronic kidney disease: Secondary | ICD-10-CM | POA: Diagnosis not present

## 2020-02-06 DIAGNOSIS — E1122 Type 2 diabetes mellitus with diabetic chronic kidney disease: Secondary | ICD-10-CM | POA: Diagnosis not present

## 2020-02-06 DIAGNOSIS — E1151 Type 2 diabetes mellitus with diabetic peripheral angiopathy without gangrene: Secondary | ICD-10-CM | POA: Diagnosis not present

## 2020-02-06 DIAGNOSIS — I739 Peripheral vascular disease, unspecified: Secondary | ICD-10-CM | POA: Diagnosis not present

## 2020-02-06 DIAGNOSIS — L97919 Non-pressure chronic ulcer of unspecified part of right lower leg with unspecified severity: Secondary | ICD-10-CM | POA: Diagnosis not present

## 2020-02-13 DIAGNOSIS — I739 Peripheral vascular disease, unspecified: Secondary | ICD-10-CM | POA: Diagnosis not present

## 2020-02-13 DIAGNOSIS — N182 Chronic kidney disease, stage 2 (mild): Secondary | ICD-10-CM | POA: Diagnosis not present

## 2020-02-13 DIAGNOSIS — L97919 Non-pressure chronic ulcer of unspecified part of right lower leg with unspecified severity: Secondary | ICD-10-CM | POA: Diagnosis not present

## 2020-02-13 DIAGNOSIS — E1122 Type 2 diabetes mellitus with diabetic chronic kidney disease: Secondary | ICD-10-CM | POA: Diagnosis not present

## 2020-02-13 DIAGNOSIS — E1151 Type 2 diabetes mellitus with diabetic peripheral angiopathy without gangrene: Secondary | ICD-10-CM | POA: Diagnosis not present

## 2020-02-13 DIAGNOSIS — I129 Hypertensive chronic kidney disease with stage 1 through stage 4 chronic kidney disease, or unspecified chronic kidney disease: Secondary | ICD-10-CM | POA: Diagnosis not present

## 2020-02-14 DIAGNOSIS — C61 Malignant neoplasm of prostate: Secondary | ICD-10-CM | POA: Diagnosis not present

## 2020-02-16 DIAGNOSIS — E1122 Type 2 diabetes mellitus with diabetic chronic kidney disease: Secondary | ICD-10-CM | POA: Diagnosis not present

## 2020-02-16 DIAGNOSIS — N182 Chronic kidney disease, stage 2 (mild): Secondary | ICD-10-CM | POA: Diagnosis not present

## 2020-02-16 DIAGNOSIS — E1151 Type 2 diabetes mellitus with diabetic peripheral angiopathy without gangrene: Secondary | ICD-10-CM | POA: Diagnosis not present

## 2020-02-16 DIAGNOSIS — I739 Peripheral vascular disease, unspecified: Secondary | ICD-10-CM | POA: Diagnosis not present

## 2020-02-16 DIAGNOSIS — I129 Hypertensive chronic kidney disease with stage 1 through stage 4 chronic kidney disease, or unspecified chronic kidney disease: Secondary | ICD-10-CM | POA: Diagnosis not present

## 2020-02-16 DIAGNOSIS — L97919 Non-pressure chronic ulcer of unspecified part of right lower leg with unspecified severity: Secondary | ICD-10-CM | POA: Diagnosis not present

## 2020-02-20 DIAGNOSIS — E1151 Type 2 diabetes mellitus with diabetic peripheral angiopathy without gangrene: Secondary | ICD-10-CM | POA: Diagnosis not present

## 2020-02-20 DIAGNOSIS — E1122 Type 2 diabetes mellitus with diabetic chronic kidney disease: Secondary | ICD-10-CM | POA: Diagnosis not present

## 2020-02-20 DIAGNOSIS — C61 Malignant neoplasm of prostate: Secondary | ICD-10-CM | POA: Diagnosis not present

## 2020-02-20 DIAGNOSIS — N182 Chronic kidney disease, stage 2 (mild): Secondary | ICD-10-CM | POA: Diagnosis not present

## 2020-02-20 DIAGNOSIS — L97919 Non-pressure chronic ulcer of unspecified part of right lower leg with unspecified severity: Secondary | ICD-10-CM | POA: Diagnosis not present

## 2020-02-20 DIAGNOSIS — I739 Peripheral vascular disease, unspecified: Secondary | ICD-10-CM | POA: Diagnosis not present

## 2020-02-20 DIAGNOSIS — C7951 Secondary malignant neoplasm of bone: Secondary | ICD-10-CM | POA: Diagnosis not present

## 2020-02-20 DIAGNOSIS — I129 Hypertensive chronic kidney disease with stage 1 through stage 4 chronic kidney disease, or unspecified chronic kidney disease: Secondary | ICD-10-CM | POA: Diagnosis not present

## 2020-02-23 DIAGNOSIS — D631 Anemia in chronic kidney disease: Secondary | ICD-10-CM | POA: Diagnosis not present

## 2020-02-23 DIAGNOSIS — Z794 Long term (current) use of insulin: Secondary | ICD-10-CM | POA: Diagnosis not present

## 2020-02-23 DIAGNOSIS — I129 Hypertensive chronic kidney disease with stage 1 through stage 4 chronic kidney disease, or unspecified chronic kidney disease: Secondary | ICD-10-CM | POA: Diagnosis not present

## 2020-02-23 DIAGNOSIS — Z7982 Long term (current) use of aspirin: Secondary | ICD-10-CM | POA: Diagnosis not present

## 2020-02-23 DIAGNOSIS — E039 Hypothyroidism, unspecified: Secondary | ICD-10-CM | POA: Diagnosis not present

## 2020-02-23 DIAGNOSIS — L97919 Non-pressure chronic ulcer of unspecified part of right lower leg with unspecified severity: Secondary | ICD-10-CM | POA: Diagnosis not present

## 2020-02-23 DIAGNOSIS — E11319 Type 2 diabetes mellitus with unspecified diabetic retinopathy without macular edema: Secondary | ICD-10-CM | POA: Diagnosis not present

## 2020-02-23 DIAGNOSIS — E1122 Type 2 diabetes mellitus with diabetic chronic kidney disease: Secondary | ICD-10-CM | POA: Diagnosis not present

## 2020-02-23 DIAGNOSIS — N182 Chronic kidney disease, stage 2 (mild): Secondary | ICD-10-CM | POA: Diagnosis not present

## 2020-02-23 DIAGNOSIS — C61 Malignant neoplasm of prostate: Secondary | ICD-10-CM | POA: Diagnosis not present

## 2020-02-23 DIAGNOSIS — I739 Peripheral vascular disease, unspecified: Secondary | ICD-10-CM | POA: Diagnosis not present

## 2020-02-23 DIAGNOSIS — E1151 Type 2 diabetes mellitus with diabetic peripheral angiopathy without gangrene: Secondary | ICD-10-CM | POA: Diagnosis not present

## 2020-02-27 DIAGNOSIS — L97919 Non-pressure chronic ulcer of unspecified part of right lower leg with unspecified severity: Secondary | ICD-10-CM | POA: Diagnosis not present

## 2020-02-27 DIAGNOSIS — I129 Hypertensive chronic kidney disease with stage 1 through stage 4 chronic kidney disease, or unspecified chronic kidney disease: Secondary | ICD-10-CM | POA: Diagnosis not present

## 2020-02-27 DIAGNOSIS — E1151 Type 2 diabetes mellitus with diabetic peripheral angiopathy without gangrene: Secondary | ICD-10-CM | POA: Diagnosis not present

## 2020-02-27 DIAGNOSIS — N182 Chronic kidney disease, stage 2 (mild): Secondary | ICD-10-CM | POA: Diagnosis not present

## 2020-02-27 DIAGNOSIS — E1122 Type 2 diabetes mellitus with diabetic chronic kidney disease: Secondary | ICD-10-CM | POA: Diagnosis not present

## 2020-02-27 DIAGNOSIS — I739 Peripheral vascular disease, unspecified: Secondary | ICD-10-CM | POA: Diagnosis not present

## 2020-03-01 DIAGNOSIS — E1151 Type 2 diabetes mellitus with diabetic peripheral angiopathy without gangrene: Secondary | ICD-10-CM | POA: Diagnosis not present

## 2020-03-01 DIAGNOSIS — I129 Hypertensive chronic kidney disease with stage 1 through stage 4 chronic kidney disease, or unspecified chronic kidney disease: Secondary | ICD-10-CM | POA: Diagnosis not present

## 2020-03-01 DIAGNOSIS — N182 Chronic kidney disease, stage 2 (mild): Secondary | ICD-10-CM | POA: Diagnosis not present

## 2020-03-01 DIAGNOSIS — I739 Peripheral vascular disease, unspecified: Secondary | ICD-10-CM | POA: Diagnosis not present

## 2020-03-01 DIAGNOSIS — E1122 Type 2 diabetes mellitus with diabetic chronic kidney disease: Secondary | ICD-10-CM | POA: Diagnosis not present

## 2020-03-01 DIAGNOSIS — L97919 Non-pressure chronic ulcer of unspecified part of right lower leg with unspecified severity: Secondary | ICD-10-CM | POA: Diagnosis not present

## 2020-03-05 DIAGNOSIS — I739 Peripheral vascular disease, unspecified: Secondary | ICD-10-CM | POA: Diagnosis not present

## 2020-03-05 DIAGNOSIS — N182 Chronic kidney disease, stage 2 (mild): Secondary | ICD-10-CM | POA: Diagnosis not present

## 2020-03-05 DIAGNOSIS — E1122 Type 2 diabetes mellitus with diabetic chronic kidney disease: Secondary | ICD-10-CM | POA: Diagnosis not present

## 2020-03-05 DIAGNOSIS — L97919 Non-pressure chronic ulcer of unspecified part of right lower leg with unspecified severity: Secondary | ICD-10-CM | POA: Diagnosis not present

## 2020-03-05 DIAGNOSIS — E1151 Type 2 diabetes mellitus with diabetic peripheral angiopathy without gangrene: Secondary | ICD-10-CM | POA: Diagnosis not present

## 2020-03-05 DIAGNOSIS — I129 Hypertensive chronic kidney disease with stage 1 through stage 4 chronic kidney disease, or unspecified chronic kidney disease: Secondary | ICD-10-CM | POA: Diagnosis not present

## 2020-03-08 DIAGNOSIS — N182 Chronic kidney disease, stage 2 (mild): Secondary | ICD-10-CM | POA: Diagnosis not present

## 2020-03-08 DIAGNOSIS — L97919 Non-pressure chronic ulcer of unspecified part of right lower leg with unspecified severity: Secondary | ICD-10-CM | POA: Diagnosis not present

## 2020-03-08 DIAGNOSIS — I739 Peripheral vascular disease, unspecified: Secondary | ICD-10-CM | POA: Diagnosis not present

## 2020-03-08 DIAGNOSIS — E1151 Type 2 diabetes mellitus with diabetic peripheral angiopathy without gangrene: Secondary | ICD-10-CM | POA: Diagnosis not present

## 2020-03-08 DIAGNOSIS — E1122 Type 2 diabetes mellitus with diabetic chronic kidney disease: Secondary | ICD-10-CM | POA: Diagnosis not present

## 2020-03-08 DIAGNOSIS — I129 Hypertensive chronic kidney disease with stage 1 through stage 4 chronic kidney disease, or unspecified chronic kidney disease: Secondary | ICD-10-CM | POA: Diagnosis not present

## 2020-03-12 DIAGNOSIS — E1151 Type 2 diabetes mellitus with diabetic peripheral angiopathy without gangrene: Secondary | ICD-10-CM | POA: Diagnosis not present

## 2020-03-12 DIAGNOSIS — N182 Chronic kidney disease, stage 2 (mild): Secondary | ICD-10-CM | POA: Diagnosis not present

## 2020-03-12 DIAGNOSIS — I129 Hypertensive chronic kidney disease with stage 1 through stage 4 chronic kidney disease, or unspecified chronic kidney disease: Secondary | ICD-10-CM | POA: Diagnosis not present

## 2020-03-12 DIAGNOSIS — L97919 Non-pressure chronic ulcer of unspecified part of right lower leg with unspecified severity: Secondary | ICD-10-CM | POA: Diagnosis not present

## 2020-03-12 DIAGNOSIS — I739 Peripheral vascular disease, unspecified: Secondary | ICD-10-CM | POA: Diagnosis not present

## 2020-03-12 DIAGNOSIS — E1122 Type 2 diabetes mellitus with diabetic chronic kidney disease: Secondary | ICD-10-CM | POA: Diagnosis not present

## 2020-03-13 ENCOUNTER — Other Ambulatory Visit: Payer: Self-pay | Admitting: Urology

## 2020-03-13 DIAGNOSIS — Z192 Hormone resistant malignancy status: Secondary | ICD-10-CM

## 2020-03-15 DIAGNOSIS — E1122 Type 2 diabetes mellitus with diabetic chronic kidney disease: Secondary | ICD-10-CM | POA: Diagnosis not present

## 2020-03-15 DIAGNOSIS — N182 Chronic kidney disease, stage 2 (mild): Secondary | ICD-10-CM | POA: Diagnosis not present

## 2020-03-15 DIAGNOSIS — L97919 Non-pressure chronic ulcer of unspecified part of right lower leg with unspecified severity: Secondary | ICD-10-CM | POA: Diagnosis not present

## 2020-03-15 DIAGNOSIS — I129 Hypertensive chronic kidney disease with stage 1 through stage 4 chronic kidney disease, or unspecified chronic kidney disease: Secondary | ICD-10-CM | POA: Diagnosis not present

## 2020-03-15 DIAGNOSIS — I739 Peripheral vascular disease, unspecified: Secondary | ICD-10-CM | POA: Diagnosis not present

## 2020-03-15 DIAGNOSIS — E1151 Type 2 diabetes mellitus with diabetic peripheral angiopathy without gangrene: Secondary | ICD-10-CM | POA: Diagnosis not present

## 2020-03-19 DIAGNOSIS — E1122 Type 2 diabetes mellitus with diabetic chronic kidney disease: Secondary | ICD-10-CM | POA: Diagnosis not present

## 2020-03-19 DIAGNOSIS — I129 Hypertensive chronic kidney disease with stage 1 through stage 4 chronic kidney disease, or unspecified chronic kidney disease: Secondary | ICD-10-CM | POA: Diagnosis not present

## 2020-03-19 DIAGNOSIS — L97919 Non-pressure chronic ulcer of unspecified part of right lower leg with unspecified severity: Secondary | ICD-10-CM | POA: Diagnosis not present

## 2020-03-19 DIAGNOSIS — E1151 Type 2 diabetes mellitus with diabetic peripheral angiopathy without gangrene: Secondary | ICD-10-CM | POA: Diagnosis not present

## 2020-03-19 DIAGNOSIS — I739 Peripheral vascular disease, unspecified: Secondary | ICD-10-CM | POA: Diagnosis not present

## 2020-03-19 DIAGNOSIS — N182 Chronic kidney disease, stage 2 (mild): Secondary | ICD-10-CM | POA: Diagnosis not present

## 2020-03-22 DIAGNOSIS — L97919 Non-pressure chronic ulcer of unspecified part of right lower leg with unspecified severity: Secondary | ICD-10-CM | POA: Diagnosis not present

## 2020-03-22 DIAGNOSIS — E1122 Type 2 diabetes mellitus with diabetic chronic kidney disease: Secondary | ICD-10-CM | POA: Diagnosis not present

## 2020-03-22 DIAGNOSIS — I129 Hypertensive chronic kidney disease with stage 1 through stage 4 chronic kidney disease, or unspecified chronic kidney disease: Secondary | ICD-10-CM | POA: Diagnosis not present

## 2020-03-22 DIAGNOSIS — I739 Peripheral vascular disease, unspecified: Secondary | ICD-10-CM | POA: Diagnosis not present

## 2020-03-22 DIAGNOSIS — N182 Chronic kidney disease, stage 2 (mild): Secondary | ICD-10-CM | POA: Diagnosis not present

## 2020-03-22 DIAGNOSIS — E1151 Type 2 diabetes mellitus with diabetic peripheral angiopathy without gangrene: Secondary | ICD-10-CM | POA: Diagnosis not present

## 2020-04-04 DIAGNOSIS — E11622 Type 2 diabetes mellitus with other skin ulcer: Secondary | ICD-10-CM | POA: Diagnosis not present

## 2020-04-04 DIAGNOSIS — E7800 Pure hypercholesterolemia, unspecified: Secondary | ICD-10-CM | POA: Diagnosis not present

## 2020-04-04 DIAGNOSIS — E1122 Type 2 diabetes mellitus with diabetic chronic kidney disease: Secondary | ICD-10-CM | POA: Diagnosis not present

## 2020-04-04 DIAGNOSIS — E1151 Type 2 diabetes mellitus with diabetic peripheral angiopathy without gangrene: Secondary | ICD-10-CM | POA: Diagnosis not present

## 2020-04-04 DIAGNOSIS — I872 Venous insufficiency (chronic) (peripheral): Secondary | ICD-10-CM | POA: Diagnosis not present

## 2020-04-04 DIAGNOSIS — C61 Malignant neoplasm of prostate: Secondary | ICD-10-CM | POA: Diagnosis not present

## 2020-04-04 DIAGNOSIS — Z79899 Other long term (current) drug therapy: Secondary | ICD-10-CM | POA: Diagnosis not present

## 2020-04-04 DIAGNOSIS — Z9181 History of falling: Secondary | ICD-10-CM | POA: Diagnosis not present

## 2020-04-04 DIAGNOSIS — E039 Hypothyroidism, unspecified: Secondary | ICD-10-CM | POA: Diagnosis not present

## 2020-04-04 DIAGNOSIS — L97219 Non-pressure chronic ulcer of right calf with unspecified severity: Secondary | ICD-10-CM | POA: Diagnosis not present

## 2020-04-04 DIAGNOSIS — D631 Anemia in chronic kidney disease: Secondary | ICD-10-CM | POA: Diagnosis not present

## 2020-04-04 DIAGNOSIS — Z794 Long term (current) use of insulin: Secondary | ICD-10-CM | POA: Diagnosis not present

## 2020-04-04 DIAGNOSIS — E11319 Type 2 diabetes mellitus with unspecified diabetic retinopathy without macular edema: Secondary | ICD-10-CM | POA: Diagnosis not present

## 2020-04-04 DIAGNOSIS — Z7984 Long term (current) use of oral hypoglycemic drugs: Secondary | ICD-10-CM | POA: Diagnosis not present

## 2020-04-04 DIAGNOSIS — N182 Chronic kidney disease, stage 2 (mild): Secondary | ICD-10-CM | POA: Diagnosis not present

## 2020-04-04 DIAGNOSIS — Z4800 Encounter for change or removal of nonsurgical wound dressing: Secondary | ICD-10-CM | POA: Diagnosis not present

## 2020-04-04 DIAGNOSIS — I129 Hypertensive chronic kidney disease with stage 1 through stage 4 chronic kidney disease, or unspecified chronic kidney disease: Secondary | ICD-10-CM | POA: Diagnosis not present

## 2020-04-05 DIAGNOSIS — E039 Hypothyroidism, unspecified: Secondary | ICD-10-CM | POA: Diagnosis not present

## 2020-04-05 DIAGNOSIS — Z125 Encounter for screening for malignant neoplasm of prostate: Secondary | ICD-10-CM | POA: Diagnosis not present

## 2020-04-05 DIAGNOSIS — E11319 Type 2 diabetes mellitus with unspecified diabetic retinopathy without macular edema: Secondary | ICD-10-CM | POA: Diagnosis not present

## 2020-04-09 DIAGNOSIS — E1122 Type 2 diabetes mellitus with diabetic chronic kidney disease: Secondary | ICD-10-CM | POA: Diagnosis not present

## 2020-04-09 DIAGNOSIS — Z4800 Encounter for change or removal of nonsurgical wound dressing: Secondary | ICD-10-CM | POA: Diagnosis not present

## 2020-04-09 DIAGNOSIS — I129 Hypertensive chronic kidney disease with stage 1 through stage 4 chronic kidney disease, or unspecified chronic kidney disease: Secondary | ICD-10-CM | POA: Diagnosis not present

## 2020-04-09 DIAGNOSIS — L97219 Non-pressure chronic ulcer of right calf with unspecified severity: Secondary | ICD-10-CM | POA: Diagnosis not present

## 2020-04-09 DIAGNOSIS — E1151 Type 2 diabetes mellitus with diabetic peripheral angiopathy without gangrene: Secondary | ICD-10-CM | POA: Diagnosis not present

## 2020-04-09 DIAGNOSIS — I872 Venous insufficiency (chronic) (peripheral): Secondary | ICD-10-CM | POA: Diagnosis not present

## 2020-04-12 DIAGNOSIS — R82998 Other abnormal findings in urine: Secondary | ICD-10-CM | POA: Diagnosis not present

## 2020-04-12 DIAGNOSIS — Z0000 Encounter for general adult medical examination without abnormal findings: Secondary | ICD-10-CM | POA: Diagnosis not present

## 2020-04-12 DIAGNOSIS — I739 Peripheral vascular disease, unspecified: Secondary | ICD-10-CM | POA: Diagnosis not present

## 2020-04-12 DIAGNOSIS — E7800 Pure hypercholesterolemia, unspecified: Secondary | ICD-10-CM | POA: Diagnosis not present

## 2020-04-12 DIAGNOSIS — I129 Hypertensive chronic kidney disease with stage 1 through stage 4 chronic kidney disease, or unspecified chronic kidney disease: Secondary | ICD-10-CM | POA: Diagnosis not present

## 2020-04-12 DIAGNOSIS — L97219 Non-pressure chronic ulcer of right calf with unspecified severity: Secondary | ICD-10-CM | POA: Diagnosis not present

## 2020-04-12 DIAGNOSIS — E039 Hypothyroidism, unspecified: Secondary | ICD-10-CM | POA: Diagnosis not present

## 2020-04-12 DIAGNOSIS — I872 Venous insufficiency (chronic) (peripheral): Secondary | ICD-10-CM | POA: Diagnosis not present

## 2020-04-12 DIAGNOSIS — Z4800 Encounter for change or removal of nonsurgical wound dressing: Secondary | ICD-10-CM | POA: Diagnosis not present

## 2020-04-12 DIAGNOSIS — Z794 Long term (current) use of insulin: Secondary | ICD-10-CM | POA: Diagnosis not present

## 2020-04-12 DIAGNOSIS — E1122 Type 2 diabetes mellitus with diabetic chronic kidney disease: Secondary | ICD-10-CM | POA: Diagnosis not present

## 2020-04-12 DIAGNOSIS — Z23 Encounter for immunization: Secondary | ICD-10-CM | POA: Diagnosis not present

## 2020-04-12 DIAGNOSIS — C61 Malignant neoplasm of prostate: Secondary | ICD-10-CM | POA: Diagnosis not present

## 2020-04-12 DIAGNOSIS — E1149 Type 2 diabetes mellitus with other diabetic neurological complication: Secondary | ICD-10-CM | POA: Diagnosis not present

## 2020-04-12 DIAGNOSIS — E11622 Type 2 diabetes mellitus with other skin ulcer: Secondary | ICD-10-CM | POA: Diagnosis not present

## 2020-04-12 DIAGNOSIS — E1151 Type 2 diabetes mellitus with diabetic peripheral angiopathy without gangrene: Secondary | ICD-10-CM | POA: Diagnosis not present

## 2020-04-12 DIAGNOSIS — N182 Chronic kidney disease, stage 2 (mild): Secondary | ICD-10-CM | POA: Diagnosis not present

## 2020-04-16 DIAGNOSIS — Z4800 Encounter for change or removal of nonsurgical wound dressing: Secondary | ICD-10-CM | POA: Diagnosis not present

## 2020-04-16 DIAGNOSIS — L97219 Non-pressure chronic ulcer of right calf with unspecified severity: Secondary | ICD-10-CM | POA: Diagnosis not present

## 2020-04-16 DIAGNOSIS — E1151 Type 2 diabetes mellitus with diabetic peripheral angiopathy without gangrene: Secondary | ICD-10-CM | POA: Diagnosis not present

## 2020-04-16 DIAGNOSIS — I872 Venous insufficiency (chronic) (peripheral): Secondary | ICD-10-CM | POA: Diagnosis not present

## 2020-04-16 DIAGNOSIS — I129 Hypertensive chronic kidney disease with stage 1 through stage 4 chronic kidney disease, or unspecified chronic kidney disease: Secondary | ICD-10-CM | POA: Diagnosis not present

## 2020-04-16 DIAGNOSIS — E1122 Type 2 diabetes mellitus with diabetic chronic kidney disease: Secondary | ICD-10-CM | POA: Diagnosis not present

## 2020-04-19 DIAGNOSIS — E1122 Type 2 diabetes mellitus with diabetic chronic kidney disease: Secondary | ICD-10-CM | POA: Diagnosis not present

## 2020-04-19 DIAGNOSIS — L97219 Non-pressure chronic ulcer of right calf with unspecified severity: Secondary | ICD-10-CM | POA: Diagnosis not present

## 2020-04-19 DIAGNOSIS — Z4800 Encounter for change or removal of nonsurgical wound dressing: Secondary | ICD-10-CM | POA: Diagnosis not present

## 2020-04-19 DIAGNOSIS — I872 Venous insufficiency (chronic) (peripheral): Secondary | ICD-10-CM | POA: Diagnosis not present

## 2020-04-19 DIAGNOSIS — E1151 Type 2 diabetes mellitus with diabetic peripheral angiopathy without gangrene: Secondary | ICD-10-CM | POA: Diagnosis not present

## 2020-04-19 DIAGNOSIS — I129 Hypertensive chronic kidney disease with stage 1 through stage 4 chronic kidney disease, or unspecified chronic kidney disease: Secondary | ICD-10-CM | POA: Diagnosis not present

## 2020-04-23 DIAGNOSIS — Z4800 Encounter for change or removal of nonsurgical wound dressing: Secondary | ICD-10-CM | POA: Diagnosis not present

## 2020-04-23 DIAGNOSIS — E1122 Type 2 diabetes mellitus with diabetic chronic kidney disease: Secondary | ICD-10-CM | POA: Diagnosis not present

## 2020-04-23 DIAGNOSIS — L97219 Non-pressure chronic ulcer of right calf with unspecified severity: Secondary | ICD-10-CM | POA: Diagnosis not present

## 2020-04-23 DIAGNOSIS — E1151 Type 2 diabetes mellitus with diabetic peripheral angiopathy without gangrene: Secondary | ICD-10-CM | POA: Diagnosis not present

## 2020-04-23 DIAGNOSIS — I129 Hypertensive chronic kidney disease with stage 1 through stage 4 chronic kidney disease, or unspecified chronic kidney disease: Secondary | ICD-10-CM | POA: Diagnosis not present

## 2020-04-23 DIAGNOSIS — I872 Venous insufficiency (chronic) (peripheral): Secondary | ICD-10-CM | POA: Diagnosis not present

## 2020-04-26 DIAGNOSIS — E1151 Type 2 diabetes mellitus with diabetic peripheral angiopathy without gangrene: Secondary | ICD-10-CM | POA: Diagnosis not present

## 2020-04-26 DIAGNOSIS — I129 Hypertensive chronic kidney disease with stage 1 through stage 4 chronic kidney disease, or unspecified chronic kidney disease: Secondary | ICD-10-CM | POA: Diagnosis not present

## 2020-04-26 DIAGNOSIS — E1122 Type 2 diabetes mellitus with diabetic chronic kidney disease: Secondary | ICD-10-CM | POA: Diagnosis not present

## 2020-04-26 DIAGNOSIS — I872 Venous insufficiency (chronic) (peripheral): Secondary | ICD-10-CM | POA: Diagnosis not present

## 2020-04-26 DIAGNOSIS — Z4800 Encounter for change or removal of nonsurgical wound dressing: Secondary | ICD-10-CM | POA: Diagnosis not present

## 2020-04-26 DIAGNOSIS — L97219 Non-pressure chronic ulcer of right calf with unspecified severity: Secondary | ICD-10-CM | POA: Diagnosis not present

## 2020-04-30 DIAGNOSIS — Z4800 Encounter for change or removal of nonsurgical wound dressing: Secondary | ICD-10-CM | POA: Diagnosis not present

## 2020-04-30 DIAGNOSIS — E1151 Type 2 diabetes mellitus with diabetic peripheral angiopathy without gangrene: Secondary | ICD-10-CM | POA: Diagnosis not present

## 2020-04-30 DIAGNOSIS — I872 Venous insufficiency (chronic) (peripheral): Secondary | ICD-10-CM | POA: Diagnosis not present

## 2020-04-30 DIAGNOSIS — L97219 Non-pressure chronic ulcer of right calf with unspecified severity: Secondary | ICD-10-CM | POA: Diagnosis not present

## 2020-04-30 DIAGNOSIS — I129 Hypertensive chronic kidney disease with stage 1 through stage 4 chronic kidney disease, or unspecified chronic kidney disease: Secondary | ICD-10-CM | POA: Diagnosis not present

## 2020-04-30 DIAGNOSIS — E1122 Type 2 diabetes mellitus with diabetic chronic kidney disease: Secondary | ICD-10-CM | POA: Diagnosis not present

## 2020-05-03 DIAGNOSIS — I872 Venous insufficiency (chronic) (peripheral): Secondary | ICD-10-CM | POA: Diagnosis not present

## 2020-05-03 DIAGNOSIS — I129 Hypertensive chronic kidney disease with stage 1 through stage 4 chronic kidney disease, or unspecified chronic kidney disease: Secondary | ICD-10-CM | POA: Diagnosis not present

## 2020-05-03 DIAGNOSIS — E1122 Type 2 diabetes mellitus with diabetic chronic kidney disease: Secondary | ICD-10-CM | POA: Diagnosis not present

## 2020-05-03 DIAGNOSIS — E1151 Type 2 diabetes mellitus with diabetic peripheral angiopathy without gangrene: Secondary | ICD-10-CM | POA: Diagnosis not present

## 2020-05-03 DIAGNOSIS — L97219 Non-pressure chronic ulcer of right calf with unspecified severity: Secondary | ICD-10-CM | POA: Diagnosis not present

## 2020-05-03 DIAGNOSIS — Z4800 Encounter for change or removal of nonsurgical wound dressing: Secondary | ICD-10-CM | POA: Diagnosis not present

## 2020-05-04 DIAGNOSIS — I129 Hypertensive chronic kidney disease with stage 1 through stage 4 chronic kidney disease, or unspecified chronic kidney disease: Secondary | ICD-10-CM | POA: Diagnosis not present

## 2020-05-04 DIAGNOSIS — E7800 Pure hypercholesterolemia, unspecified: Secondary | ICD-10-CM | POA: Diagnosis not present

## 2020-05-04 DIAGNOSIS — Z79899 Other long term (current) drug therapy: Secondary | ICD-10-CM | POA: Diagnosis not present

## 2020-05-04 DIAGNOSIS — D631 Anemia in chronic kidney disease: Secondary | ICD-10-CM | POA: Diagnosis not present

## 2020-05-04 DIAGNOSIS — Z4800 Encounter for change or removal of nonsurgical wound dressing: Secondary | ICD-10-CM | POA: Diagnosis not present

## 2020-05-04 DIAGNOSIS — E11319 Type 2 diabetes mellitus with unspecified diabetic retinopathy without macular edema: Secondary | ICD-10-CM | POA: Diagnosis not present

## 2020-05-04 DIAGNOSIS — E1122 Type 2 diabetes mellitus with diabetic chronic kidney disease: Secondary | ICD-10-CM | POA: Diagnosis not present

## 2020-05-04 DIAGNOSIS — Z7984 Long term (current) use of oral hypoglycemic drugs: Secondary | ICD-10-CM | POA: Diagnosis not present

## 2020-05-04 DIAGNOSIS — N182 Chronic kidney disease, stage 2 (mild): Secondary | ICD-10-CM | POA: Diagnosis not present

## 2020-05-04 DIAGNOSIS — Z9181 History of falling: Secondary | ICD-10-CM | POA: Diagnosis not present

## 2020-05-04 DIAGNOSIS — L97219 Non-pressure chronic ulcer of right calf with unspecified severity: Secondary | ICD-10-CM | POA: Diagnosis not present

## 2020-05-04 DIAGNOSIS — C61 Malignant neoplasm of prostate: Secondary | ICD-10-CM | POA: Diagnosis not present

## 2020-05-04 DIAGNOSIS — I872 Venous insufficiency (chronic) (peripheral): Secondary | ICD-10-CM | POA: Diagnosis not present

## 2020-05-04 DIAGNOSIS — Z794 Long term (current) use of insulin: Secondary | ICD-10-CM | POA: Diagnosis not present

## 2020-05-04 DIAGNOSIS — E1151 Type 2 diabetes mellitus with diabetic peripheral angiopathy without gangrene: Secondary | ICD-10-CM | POA: Diagnosis not present

## 2020-05-04 DIAGNOSIS — E039 Hypothyroidism, unspecified: Secondary | ICD-10-CM | POA: Diagnosis not present

## 2020-05-07 DIAGNOSIS — E1122 Type 2 diabetes mellitus with diabetic chronic kidney disease: Secondary | ICD-10-CM | POA: Diagnosis not present

## 2020-05-07 DIAGNOSIS — I129 Hypertensive chronic kidney disease with stage 1 through stage 4 chronic kidney disease, or unspecified chronic kidney disease: Secondary | ICD-10-CM | POA: Diagnosis not present

## 2020-05-07 DIAGNOSIS — I872 Venous insufficiency (chronic) (peripheral): Secondary | ICD-10-CM | POA: Diagnosis not present

## 2020-05-07 DIAGNOSIS — L97219 Non-pressure chronic ulcer of right calf with unspecified severity: Secondary | ICD-10-CM | POA: Diagnosis not present

## 2020-05-07 DIAGNOSIS — Z4800 Encounter for change or removal of nonsurgical wound dressing: Secondary | ICD-10-CM | POA: Diagnosis not present

## 2020-05-07 DIAGNOSIS — E1151 Type 2 diabetes mellitus with diabetic peripheral angiopathy without gangrene: Secondary | ICD-10-CM | POA: Diagnosis not present

## 2020-05-14 DIAGNOSIS — L97219 Non-pressure chronic ulcer of right calf with unspecified severity: Secondary | ICD-10-CM | POA: Diagnosis not present

## 2020-05-14 DIAGNOSIS — E1151 Type 2 diabetes mellitus with diabetic peripheral angiopathy without gangrene: Secondary | ICD-10-CM | POA: Diagnosis not present

## 2020-05-14 DIAGNOSIS — Z4800 Encounter for change or removal of nonsurgical wound dressing: Secondary | ICD-10-CM | POA: Diagnosis not present

## 2020-05-14 DIAGNOSIS — I129 Hypertensive chronic kidney disease with stage 1 through stage 4 chronic kidney disease, or unspecified chronic kidney disease: Secondary | ICD-10-CM | POA: Diagnosis not present

## 2020-05-14 DIAGNOSIS — I872 Venous insufficiency (chronic) (peripheral): Secondary | ICD-10-CM | POA: Diagnosis not present

## 2020-05-14 DIAGNOSIS — E1122 Type 2 diabetes mellitus with diabetic chronic kidney disease: Secondary | ICD-10-CM | POA: Diagnosis not present

## 2020-05-16 ENCOUNTER — Ambulatory Visit (INDEPENDENT_AMBULATORY_CARE_PROVIDER_SITE_OTHER): Payer: Medicare Other | Admitting: Podiatry

## 2020-05-16 ENCOUNTER — Other Ambulatory Visit: Payer: Self-pay

## 2020-05-16 DIAGNOSIS — M79675 Pain in left toe(s): Secondary | ICD-10-CM | POA: Diagnosis not present

## 2020-05-16 DIAGNOSIS — B351 Tinea unguium: Secondary | ICD-10-CM | POA: Diagnosis not present

## 2020-05-16 DIAGNOSIS — M79674 Pain in right toe(s): Secondary | ICD-10-CM | POA: Diagnosis not present

## 2020-05-19 NOTE — Progress Notes (Signed)
  Subjective:  Patient ID: Matthew Parsons, male    DOB: 1926/11/22,  MRN: [id number]  Chief Complaint  Patient presents with  . NAIL TRIM    PT STATES HE IS HERE FOR A NAIL TRIM    86 y.o. male presents with the above complaint. History confirmed with patient.  Nails have been painful and uncomfortable.  They are unable to cut them at home.  Objective:  Physical Exam: warm, good capillary refill, no trophic changes or ulcerative lesions, normal DP and PT pulses and normal sensory exam.  Varicose veins noted.  No gross edema.  Onychomycosis x10 with yellow discolored toenails with subungual debris and significant dystrophy  Assessment:   1. Onychomycosis   2. Pain due to onychomycosis of toenails of both feet      Plan:  Patient was evaluated and treated and all questions answered.  Discussed the etiology and treatment options for the condition in detail with the patient. Educated patient on the topical and oral treatment options for mycotic nails. Recommended debridement of the nails today. Sharp and mechanical debridement performed of all painful and mycotic nails today. Nails debrided in length and thickness using a nail nipper and a mechanical burr to level of comfort. Discussed treatment options including appropriate shoe gear. Follow up as needed for painful nails.    Return in about 4 months (around 09/14/2020), or if symptoms worsen or fail to improve.

## 2020-05-21 DIAGNOSIS — E1151 Type 2 diabetes mellitus with diabetic peripheral angiopathy without gangrene: Secondary | ICD-10-CM | POA: Diagnosis not present

## 2020-05-21 DIAGNOSIS — L97219 Non-pressure chronic ulcer of right calf with unspecified severity: Secondary | ICD-10-CM | POA: Diagnosis not present

## 2020-05-21 DIAGNOSIS — I872 Venous insufficiency (chronic) (peripheral): Secondary | ICD-10-CM | POA: Diagnosis not present

## 2020-05-21 DIAGNOSIS — Z4800 Encounter for change or removal of nonsurgical wound dressing: Secondary | ICD-10-CM | POA: Diagnosis not present

## 2020-05-21 DIAGNOSIS — I129 Hypertensive chronic kidney disease with stage 1 through stage 4 chronic kidney disease, or unspecified chronic kidney disease: Secondary | ICD-10-CM | POA: Diagnosis not present

## 2020-05-21 DIAGNOSIS — E1122 Type 2 diabetes mellitus with diabetic chronic kidney disease: Secondary | ICD-10-CM | POA: Diagnosis not present

## 2020-05-29 DIAGNOSIS — Z4800 Encounter for change or removal of nonsurgical wound dressing: Secondary | ICD-10-CM | POA: Diagnosis not present

## 2020-05-29 DIAGNOSIS — L97219 Non-pressure chronic ulcer of right calf with unspecified severity: Secondary | ICD-10-CM | POA: Diagnosis not present

## 2020-05-29 DIAGNOSIS — E1122 Type 2 diabetes mellitus with diabetic chronic kidney disease: Secondary | ICD-10-CM | POA: Diagnosis not present

## 2020-05-29 DIAGNOSIS — I872 Venous insufficiency (chronic) (peripheral): Secondary | ICD-10-CM | POA: Diagnosis not present

## 2020-05-29 DIAGNOSIS — I129 Hypertensive chronic kidney disease with stage 1 through stage 4 chronic kidney disease, or unspecified chronic kidney disease: Secondary | ICD-10-CM | POA: Diagnosis not present

## 2020-05-29 DIAGNOSIS — E1151 Type 2 diabetes mellitus with diabetic peripheral angiopathy without gangrene: Secondary | ICD-10-CM | POA: Diagnosis not present

## 2020-06-26 ENCOUNTER — Other Ambulatory Visit: Payer: Self-pay | Admitting: Urology

## 2020-06-26 DIAGNOSIS — Z192 Hormone resistant malignancy status: Secondary | ICD-10-CM

## 2020-06-26 DIAGNOSIS — C61 Malignant neoplasm of prostate: Secondary | ICD-10-CM

## 2020-07-01 ENCOUNTER — Other Ambulatory Visit: Payer: TRICARE For Life (TFL)

## 2020-08-19 DIAGNOSIS — C61 Malignant neoplasm of prostate: Secondary | ICD-10-CM | POA: Diagnosis not present

## 2020-08-19 DIAGNOSIS — Z192 Hormone resistant malignancy status: Secondary | ICD-10-CM | POA: Diagnosis not present

## 2020-08-26 DIAGNOSIS — C7951 Secondary malignant neoplasm of bone: Secondary | ICD-10-CM | POA: Diagnosis not present

## 2020-08-26 DIAGNOSIS — C61 Malignant neoplasm of prostate: Secondary | ICD-10-CM | POA: Diagnosis not present

## 2020-10-10 DIAGNOSIS — Z1331 Encounter for screening for depression: Secondary | ICD-10-CM | POA: Diagnosis not present

## 2020-10-10 DIAGNOSIS — N182 Chronic kidney disease, stage 2 (mild): Secondary | ICD-10-CM | POA: Diagnosis not present

## 2020-10-10 DIAGNOSIS — E11622 Type 2 diabetes mellitus with other skin ulcer: Secondary | ICD-10-CM | POA: Diagnosis not present

## 2020-10-10 DIAGNOSIS — Z794 Long term (current) use of insulin: Secondary | ICD-10-CM | POA: Diagnosis not present

## 2020-10-10 DIAGNOSIS — D509 Iron deficiency anemia, unspecified: Secondary | ICD-10-CM | POA: Diagnosis not present

## 2020-10-10 DIAGNOSIS — E7800 Pure hypercholesterolemia, unspecified: Secondary | ICD-10-CM | POA: Diagnosis not present

## 2020-10-10 DIAGNOSIS — E46 Unspecified protein-calorie malnutrition: Secondary | ICD-10-CM | POA: Diagnosis not present

## 2020-10-10 DIAGNOSIS — E11319 Type 2 diabetes mellitus with unspecified diabetic retinopathy without macular edema: Secondary | ICD-10-CM | POA: Diagnosis not present

## 2020-10-10 DIAGNOSIS — Z1389 Encounter for screening for other disorder: Secondary | ICD-10-CM | POA: Diagnosis not present

## 2020-10-10 DIAGNOSIS — E039 Hypothyroidism, unspecified: Secondary | ICD-10-CM | POA: Diagnosis not present

## 2020-10-10 DIAGNOSIS — I129 Hypertensive chronic kidney disease with stage 1 through stage 4 chronic kidney disease, or unspecified chronic kidney disease: Secondary | ICD-10-CM | POA: Diagnosis not present

## 2020-10-10 DIAGNOSIS — C61 Malignant neoplasm of prostate: Secondary | ICD-10-CM | POA: Diagnosis not present

## 2020-10-10 DIAGNOSIS — I739 Peripheral vascular disease, unspecified: Secondary | ICD-10-CM | POA: Diagnosis not present

## 2020-10-10 DIAGNOSIS — E1149 Type 2 diabetes mellitus with other diabetic neurological complication: Secondary | ICD-10-CM | POA: Diagnosis not present

## 2020-11-01 ENCOUNTER — Other Ambulatory Visit: Payer: Medicare Other

## 2020-11-26 DIAGNOSIS — Z192 Hormone resistant malignancy status: Secondary | ICD-10-CM | POA: Diagnosis not present

## 2020-11-26 DIAGNOSIS — C61 Malignant neoplasm of prostate: Secondary | ICD-10-CM | POA: Diagnosis not present

## 2020-11-27 ENCOUNTER — Other Ambulatory Visit: Payer: Medicare Other

## 2020-12-05 DIAGNOSIS — C7951 Secondary malignant neoplasm of bone: Secondary | ICD-10-CM | POA: Diagnosis not present

## 2020-12-05 DIAGNOSIS — Z8546 Personal history of malignant neoplasm of prostate: Secondary | ICD-10-CM | POA: Diagnosis not present

## 2021-01-14 DIAGNOSIS — S41111A Laceration without foreign body of right upper arm, initial encounter: Secondary | ICD-10-CM | POA: Diagnosis not present

## 2021-01-27 ENCOUNTER — Emergency Department (HOSPITAL_COMMUNITY)
Admission: EM | Admit: 2021-01-27 | Discharge: 2021-01-27 | Disposition: A | Discharge status: Home / Self Care | Payer: Medicare Other | Attending: Emergency Medicine | Admitting: Emergency Medicine

## 2021-01-27 ENCOUNTER — Other Ambulatory Visit: Payer: Self-pay

## 2021-01-27 ENCOUNTER — Emergency Department (HOSPITAL_COMMUNITY): Payer: Medicare Other

## 2021-01-27 ENCOUNTER — Encounter (HOSPITAL_COMMUNITY): Payer: Self-pay

## 2021-01-27 DIAGNOSIS — Z23 Encounter for immunization: Secondary | ICD-10-CM | POA: Diagnosis not present

## 2021-01-27 DIAGNOSIS — E1122 Type 2 diabetes mellitus with diabetic chronic kidney disease: Secondary | ICD-10-CM | POA: Diagnosis not present

## 2021-01-27 DIAGNOSIS — Z79899 Other long term (current) drug therapy: Secondary | ICD-10-CM | POA: Insufficient documentation

## 2021-01-27 DIAGNOSIS — M4312 Spondylolisthesis, cervical region: Secondary | ICD-10-CM | POA: Diagnosis not present

## 2021-01-27 DIAGNOSIS — I129 Hypertensive chronic kidney disease with stage 1 through stage 4 chronic kidney disease, or unspecified chronic kidney disease: Secondary | ICD-10-CM | POA: Insufficient documentation

## 2021-01-27 DIAGNOSIS — W010XXA Fall on same level from slipping, tripping and stumbling without subsequent striking against object, initial encounter: Secondary | ICD-10-CM | POA: Insufficient documentation

## 2021-01-27 DIAGNOSIS — S61411A Laceration without foreign body of right hand, initial encounter: Secondary | ICD-10-CM | POA: Diagnosis not present

## 2021-01-27 DIAGNOSIS — N189 Chronic kidney disease, unspecified: Secondary | ICD-10-CM | POA: Diagnosis not present

## 2021-01-27 DIAGNOSIS — W19XXXA Unspecified fall, initial encounter: Secondary | ICD-10-CM | POA: Diagnosis not present

## 2021-01-27 DIAGNOSIS — Z85828 Personal history of other malignant neoplasm of skin: Secondary | ICD-10-CM | POA: Insufficient documentation

## 2021-01-27 DIAGNOSIS — Y92012 Bathroom of single-family (private) house as the place of occurrence of the external cause: Secondary | ICD-10-CM | POA: Diagnosis not present

## 2021-01-27 DIAGNOSIS — Z96698 Presence of other orthopedic joint implants: Secondary | ICD-10-CM | POA: Insufficient documentation

## 2021-01-27 DIAGNOSIS — Z7984 Long term (current) use of oral hypoglycemic drugs: Secondary | ICD-10-CM | POA: Insufficient documentation

## 2021-01-27 DIAGNOSIS — E039 Hypothyroidism, unspecified: Secondary | ICD-10-CM | POA: Diagnosis not present

## 2021-01-27 DIAGNOSIS — S0101XA Laceration without foreign body of scalp, initial encounter: Secondary | ICD-10-CM | POA: Diagnosis not present

## 2021-01-27 DIAGNOSIS — Z7982 Long term (current) use of aspirin: Secondary | ICD-10-CM | POA: Diagnosis not present

## 2021-01-27 DIAGNOSIS — S0990XA Unspecified injury of head, initial encounter: Secondary | ICD-10-CM | POA: Diagnosis not present

## 2021-01-27 DIAGNOSIS — I959 Hypotension, unspecified: Secondary | ICD-10-CM | POA: Diagnosis not present

## 2021-01-27 DIAGNOSIS — I6529 Occlusion and stenosis of unspecified carotid artery: Secondary | ICD-10-CM | POA: Diagnosis not present

## 2021-01-27 DIAGNOSIS — S199XXA Unspecified injury of neck, initial encounter: Secondary | ICD-10-CM | POA: Diagnosis not present

## 2021-01-27 MED ORDER — LIDOCAINE-EPINEPHRINE (PF) 2 %-1:200000 IJ SOLN
10.0000 mL | Freq: Once | INTRAMUSCULAR | Status: AC
  Administered 2021-01-27: 10 mL
  Filled 2021-01-27: qty 20

## 2021-01-27 MED ORDER — CEPHALEXIN 500 MG PO CAPS: 500.0000 mg | ORAL_CAPSULE | Freq: Three times a day (TID) | ORAL | 0 refills | Status: AC

## 2021-01-27 MED ORDER — TETANUS-DIPHTH-ACELL PERTUSSIS 5-2.5-18.5 LF-MCG/0.5 IM SUSY
0.5000 mL | PREFILLED_SYRINGE | Freq: Once | INTRAMUSCULAR | Status: AC
  Administered 2021-01-27: 0.5 mL via INTRAMUSCULAR
  Filled 2021-01-27: qty 0.5

## 2021-01-27 NOTE — Discharge Instructions (Addendum)
You may need more assistance at home if you continue to have falls.  Follow-up with your doctor in 2 or 3 days.  Return if you have fevers worsening symptoms or any additional concerns.

## 2021-01-27 NOTE — ED Triage Notes (Signed)
Per EMS- Patient is from home. Patient reports that he tripped and fell in the bathroom at 1900 last night.  Patieht has a 3" laceration to the top of his head and continues to ooze. Patient is not on blood thinners. No LOC and no pain.

## 2021-01-27 NOTE — ED Provider Notes (Signed)
Emergency Medicine Provider Triage Evaluation Note  Matthew Parsons , a 87 y.o. male  was evaluated in triage.  Pt complains of fall.  Patient reports around 7:00 last night he slipped and fell in his bathroom striking the top of his head with a laceration.  He thought he was okay so went to bed but when his daughter came to visit him this morning he was still having some intermittent oozing from the laceration and she felt he needed to get checked out.  Aside from pain over the top of his head he denies any other pain from the fall.  Not on anticoagulation  Review of Systems  Positive: Headache, laceration Negative: Chest pain, abdominal pain, pain in extremities  Physical Exam  BP 127/67 (BP Location: Right Arm)   Pulse 86   Temp (!) 97.2 F (36.2 C) (Oral)   Resp 14   Ht '5\' 5"'$  (1.651 m)   Wt 59 kg   SpO2 97%   BMI 21.63 kg/m  Gen:   Awake, no distress   Resp:  Normal effort, chest nontender MSK:   Moves extremities without difficulty  Other:  Scalp with 3 cm linear laceration, bleeding currently controlled, some tenderness over the top of the scalp without obvious step-off, no tenderness over the hips or pelvis, moving all extremities  Medical Decision Making  Medically screening exam initiated at 2:12 PM.  Appropriate orders placed.  [name] was informed that the remainder of the evaluation will be completed by another provider, this initial triage assessment does not replace that evaluation, and the importance of remaining in the ED until their evaluation is complete.     Jacqlyn Larsen, [place] 01/27/21 1414    [name], MD 01/27/21 1540

## 2021-01-27 NOTE — ED Notes (Signed)
[facility] contacted to transport pt home.

## 2021-01-27 NOTE — ED Provider Notes (Signed)
[facility] DEPT Provider Note   CSN: [id number] Arrival date & time: 01/27/21  1352     History Chief Complaint  Patient presents with   Matthew Parsons is a 87 y.o. male.  Patient presents chief complaint of a fall.  He states that he was getting up last night around 7 PM lost his balance and fell.  His son helped him up to his chair.  He denies any headache or neck pain or back pain.  Denies loss of consciousness.  His daughter checked on him today and decided bring him to the ER for evaluation.  Patient states he typically walks with a walker at baseline.      Past Medical History:  Diagnosis Date   Anemia    Arthritis    Cancer ([place])    hx of skin cancers    Chronic kidney disease    frequency    Diabetes mellitus without complication ([place])    Diarrhea    Diverticulosis    History of Diverticulitis   H/O hiatal hernia    Hyperlipidemia    Hypertension    Hypothyroidism    Inguinal hernia    Other chronic pain    Pain in limb    Pericarditis    H/O pericarditis   Stroke [facility] [place] [facility])    ? 06/25/2005    Thyroid disease    Type II or unspecified type diabetes mellitus without mention of complication, not stated as uncontrolled     There are no problems to display for this patient.   Past Surgical History:  Procedure Laterality Date   CATARACT EXTRACTION Bilateral    CHOLECYSTECTOMY     COLONOSCOPY W/ POLYPECTOMY     EYE SURGERY     cataracts   HERNIA REPAIR     X 2   INGUINAL HERNIA REPAIR  04/06/2012   Procedure: HERNIA REPAIR INGUINAL ADULT;  Surgeon: Madilyn Hook, DO;  Location: [facility] ORS;  Service: General;  Laterality: Left;   INSERTION OF MESH  04/06/2012   Procedure: INSERTION OF MESH;  Surgeon: Madilyn Hook, DO;  Location: [facility] ORS;  Service: General;  Laterality: Left;   JOINT REPLACEMENT  1999   Broken arm, crushed wrist & broken fingers   prostate trim  [place] N/A 08/25/2013   Procedure: LUMBAR SPINAL CORD STIMULATOR INSERTION-GENERATOR REPLACEMENT ;  Surgeon: Bonna Gains, MD;  Location: [facility]UR[facility];  Service: Neurosurgery;  Laterality: N/A;  LUMBAR SPINAL CORD STIMULATOR INSERTION-GENERATOR REPLACEMENT    SPINE SURGERY     TONSILECTOMY, ADENOIDECTOMY, BILATERAL MYRINGOTOMY AND TUBES  1936   TONSILLECTOMY         History reviewed. No pertinent family history.  Social History   Tobacco Use   Smoking status: Never   Smokeless tobacco: Never  Vaping Use   Vaping Use: Never used  Substance Use Topics   Alcohol use: No   Drug use: No    Home Medications Prior to Admission medications   Medication Sig Start Date End Date Taking? Authorizing Provider  cephALEXin (KEFLEX) 500 MG capsule Take 1 capsule (500 mg total) by mouth 3 (three) times daily for 5 days. 01/27/21 02/01/21 Yes [name], MD  aspirin 81 MG tablet Take 81 mg by mouth every morning.     [provider]  Calcium-Cholecalciferol (CALCET PETITES) 200-250 MG-UNIT TABS Take 2 tablets by mouth 2 (  two) times daily.     [provider]  Cholecalciferol (VITAMIN D3) 2000 UNITS TABS Take 1 tablet by mouth daily.    [provider]  Cyanocobalamin (VITAMIN B 12 PO) Take 1 capsule by mouth daily.    [provider]  fentaNYL (DURAGESIC - DOSED MCG/HR) 12 MCG/HR Place 12.5 mcg onto the skin every 3 (three) days. Reported on 10/18/2015    [provider]  finasteride (PROSCAR) 5 MG tablet Take 5 mg by mouth daily.    [provider]  gabapentin (NEURONTIN) 600 MG tablet Take 600 mg by mouth 2 (two) times daily.    [provider]  glimepiride (AMARYL) 2 MG tablet Take 2 mg by mouth daily before breakfast.    [provider]  iron polysaccharides (NIFEREX) 150 MG capsule Take 150 mg by mouth 2 (two) times daily.    [provider]  levothyroxine (SYNTHROID, LEVOTHROID) 125 MCG tablet  Take 125 mcg by mouth daily before breakfast.    [provider]  Multiple Vitamins-Minerals (CENTRUM SILVER ADULT 50+) TABS Take 1 tablet by mouth daily.    [provider]  Polyethyl Glycol-Propyl Glycol (SYSTANE) 0.4-0.3 % SOLN Apply 1 drop to eye 2 (two) times daily. Dry eyes    [provider]  rosuvastatin (CRESTOR) 5 MG tablet Take 5 mg by mouth every morning.     [provider]  silodosin (RAPAFLO) 8 MG CAPS capsule Take 8 mg by mouth daily with breakfast.    [provider]  sitaGLIPtan-metformin (JANUMET) 50-1000 MG per tablet Take 1 tablet by mouth 2 (two) times daily with a meal.    [provider]  TRAMADOL HCL ER PO Take by mouth.    [provider]  vitamin C (ASCORBIC ACID) 500 MG tablet Take 500 mg by mouth daily.    [provider]    Allergies    Hydrocodone and Methylprednisolone  Review of Systems   Review of Systems  Constitutional:  Negative for fever.  HENT:  Negative for ear pain and sore throat.   Eyes:  Negative for pain.  Respiratory:  Negative for cough.   Cardiovascular:  Negative for chest pain.  Gastrointestinal:  Negative for abdominal pain.  Genitourinary:  Negative for flank pain.  Musculoskeletal:  Negative for back pain.  Skin:  Negative for color change and rash.  Neurological:  Negative for syncope.  All other systems reviewed and are negative.  Physical Exam Updated Vital Signs BP 127/67 (BP Location: Right Arm)   Pulse 86   Temp (!) 97.2 F (36.2 C) (Oral)   Resp 14   Ht '5\' 5"'$  (1.651 m)   Wt 59 kg   SpO2 97%   BMI 21.63 kg/m   Physical Exam Constitutional:      Appearance: He is well-developed.  HENT:     Head: Normocephalic.     Comments: Frontal scalp laceration approximately 3 cm.    Nose: Nose normal.  Eyes:     Extraocular Movements: Extraocular movements intact.  Cardiovascular:     Rate and Rhythm: Normal rate.  Pulmonary:     Effort: Pulmonary  effort is normal.  Skin:    Coloration: Skin is not jaundiced.     Comments: Right hand dorsum of the hand approximately 2 cm skin tear, frontal scalp approximately 3 cm laceration noted.  Neurological:     Mental Status: He is alert. Mental status is at baseline.    ED Results /  Procedures / Treatments   Labs (all labs ordered are listed, but only abnormal results are displayed) Labs Reviewed - No data to display  EKG None  Radiology CT Head Wo Contrast  Result Date: 01/27/2021 CLINICAL DATA:  Head trauma, minor; fell and hit head EXAM: CT HEAD WITHOUT CONTRAST TECHNIQUE: Contiguous axial images were obtained from the base of the skull through the vertex without intravenous contrast. COMPARISON:  None. FINDINGS: Brain: There is no acute intracranial hemorrhage, mass effect, or edema. Gray-white differentiation is preserved. There is no extra-axial fluid collection. Patchy low-attenuation in the supratentorial white matter is nonspecific but probably reflects chronic microvascular ischemic changes. Prominence of the ventricles and sulci reflects generalized parenchymal volume loss. Vascular: There is atherosclerotic calcification at the skull base. Skull: Calvarium is unremarkable. Sinuses/Orbits: No acute finding. Other: None. IMPRESSION: No evidence of acute intracranial injury. Electronically Signed   By: Macy Mis M.D.   On: 01/27/2021 16:13   CT Cervical Spine Wo Contrast  Result Date: 01/27/2021 CLINICAL DATA:  Neck trauma, fall EXAM: CT CERVICAL SPINE WITHOUT CONTRAST TECHNIQUE: Multidetector CT imaging of the cervical spine was performed without intravenous contrast. Multiplanar CT image reconstructions were also generated. COMPARISON:  None. FINDINGS: Alignment: Trace retrolisthesis at C3-C4. Skull base and vertebrae: Vertebral body heights are maintained. No acute fracture. Soft tissues and spinal canal: No prevertebral fluid or swelling. No visible canal hematoma. Disc levels:  Multilevel degenerative changes are present including disc space narrowing, endplate osteophytes, and facet and uncovertebral hypertrophy. No significant canal stenosis. Upper chest: No apical lung mass. Other: Calcified plaque at the common carotid bifurcations. IMPRESSION: No acute cervical spine fracture. Electronically Signed   By: Macy Mis M.D.   On: 01/27/2021 16:18    Procedures Procedures   Medications Ordered in ED Medications  Tdap (BOOSTRIX) injection 0.5 mL (has no administration in time range)  lidocaine-EPINEPHrine (XYLOCAINE W/EPI) 2 %-1:200000 (PF) injection 10 mL (10 mLs Infiltration Given by Other 01/27/21 1519)    ED Course  I have reviewed the triage vital signs and the nursing notes.  Pertinent labs & imaging results that were available during my care of the patient were reviewed by me and considered in my medical decision making (see chart for details).    MDM Rules/Calculators/A&P                           Patient presents with complaint of fall.  Imaging is unremarkable patient denies any pain at this time.  To greater than 12 hours since he sustained his lacerations, wounds were cleaned and no active bleeding noted.  [place] updated.  Patient discharged home to follow-up with his doctor in 2 or 3 days.  Recommending immediate return for redness fevers purulent discharge or any additional concerns.  Final Clinical Impression(s) / ED Diagnoses Final diagnoses:  Fall, initial encounter  Laceration of scalp, initial encounter    Rx / DC Orders ED Discharge Orders          Ordered    cephALEXin (KEFLEX) 500 MG capsule  3 times daily        01/27/21 1635             [name], MD 01/27/21 1635

## 2021-01-30 DIAGNOSIS — W1830XA Fall on same level, unspecified, initial encounter: Secondary | ICD-10-CM | POA: Diagnosis not present

## 2021-01-30 DIAGNOSIS — R269 Unspecified abnormalities of gait and mobility: Secondary | ICD-10-CM | POA: Diagnosis not present

## 2021-01-30 DIAGNOSIS — R531 Weakness: Secondary | ICD-10-CM | POA: Diagnosis not present

## 2021-01-30 DIAGNOSIS — N401 Enlarged prostate with lower urinary tract symptoms: Secondary | ICD-10-CM | POA: Diagnosis not present

## 2021-01-30 DIAGNOSIS — S0101XA Laceration without foreign body of scalp, initial encounter: Secondary | ICD-10-CM | POA: Diagnosis not present

## 2021-02-03 DIAGNOSIS — I469 Cardiac arrest, cause unspecified: Secondary | ICD-10-CM | POA: Diagnosis not present

## 2021-02-03 DIAGNOSIS — I499 Cardiac arrhythmia, unspecified: Secondary | ICD-10-CM | POA: Diagnosis not present

## 2021-02-03 DIAGNOSIS — R404 Transient alteration of awareness: Secondary | ICD-10-CM | POA: Diagnosis not present

## 2021-02-03 DIAGNOSIS — R4020 Unspecified coma: Secondary | ICD-10-CM | POA: Diagnosis not present

## 2021-02-06 DIAGNOSIS — 419620001 Death: Secondary | SNOMED CT | POA: Diagnosis not present

## 2021-02-06 DEATH — deceased

## 2021-12-23 IMAGING — CT CT CERVICAL SPINE W/O CM
2 of 3 series · 11 of 27 positions shown, 14 images · non-contrast
Comparison: None.

CLINICAL DATA: Neck trauma, fall

EXAM:
CT CERVICAL SPINE WITHOUT CONTRAST
TECHNIQUE: Multidetector CT imaging of the cervical spine was performed without
intravenous contrast. Multiplanar CT image reconstructions were also
generated.

[Series 6: orthogonal bone · axial · 0.23mm/px · z∈[+1139,+1255]mm · 6 of 102 slices shown, 8 images]
[im 15/102  soft-tissue]
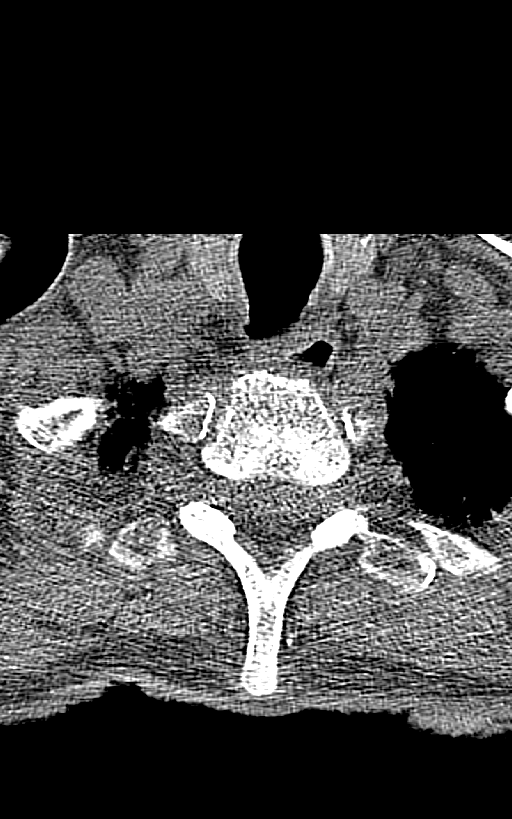
[im 15/102  bone]
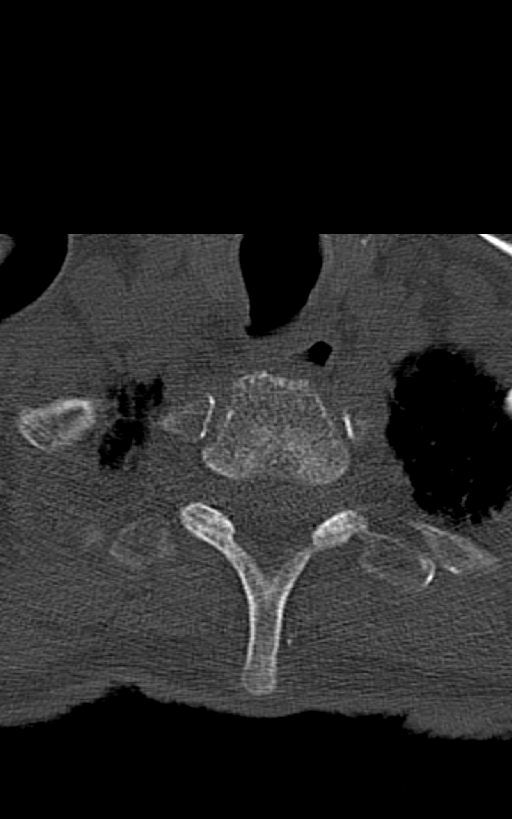
[im 29/102  bone]
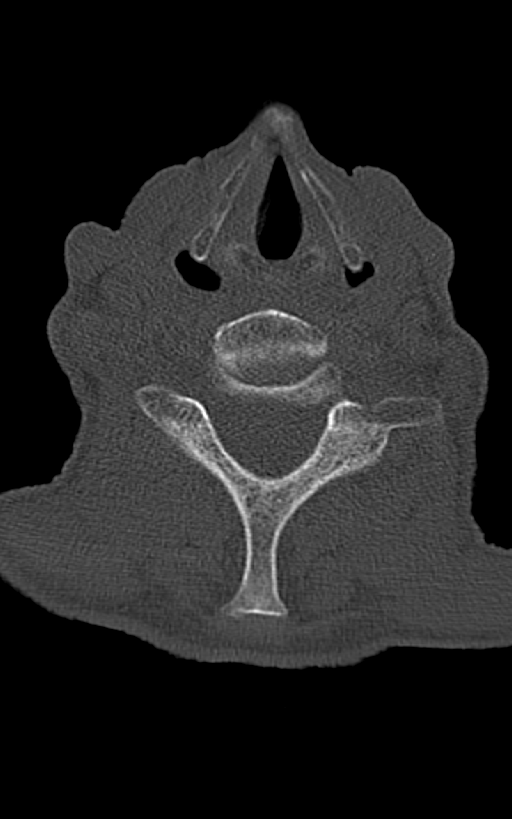
[im 44/102  bone]
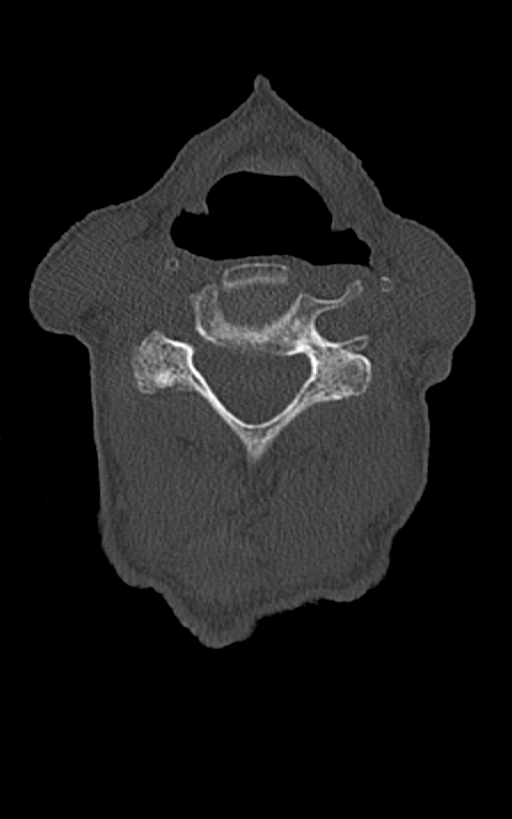
[im 58/102  bone]
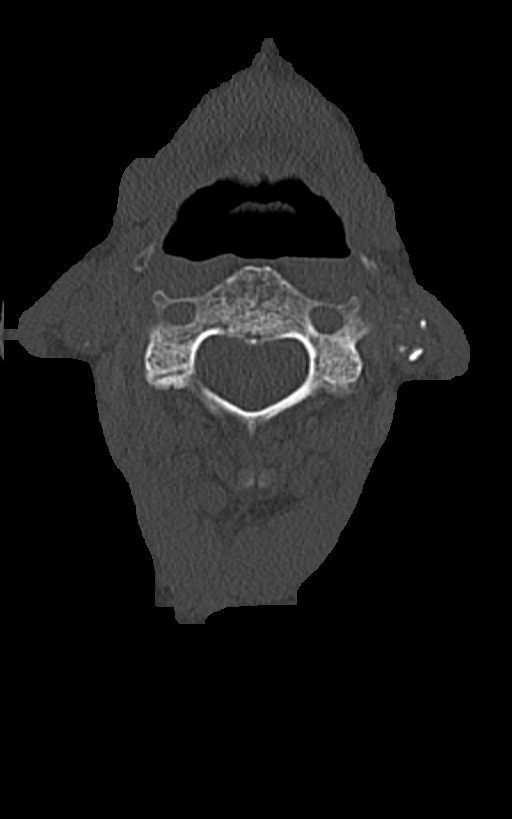
[im 73/102  soft-tissue]
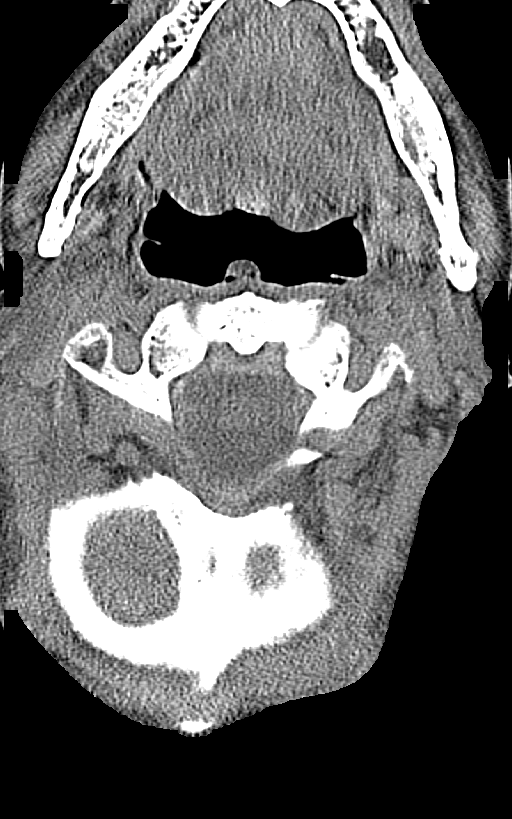
[im 73/102  bone]
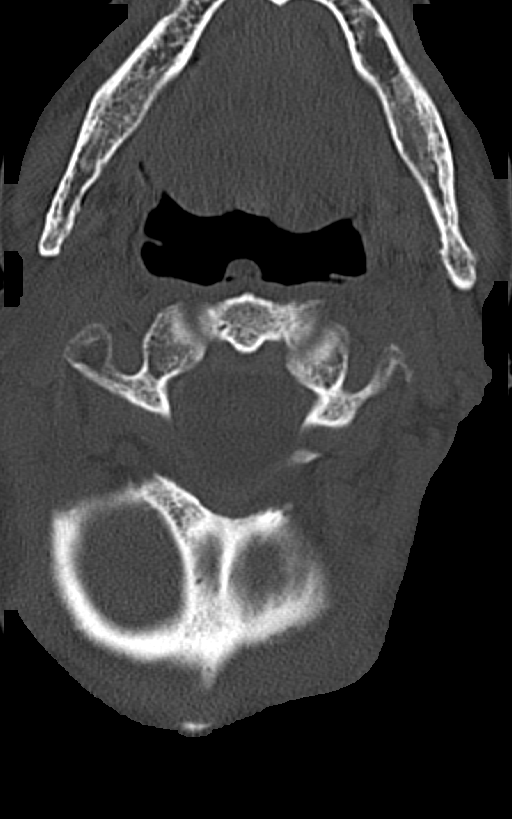
[im 87/102  bone]
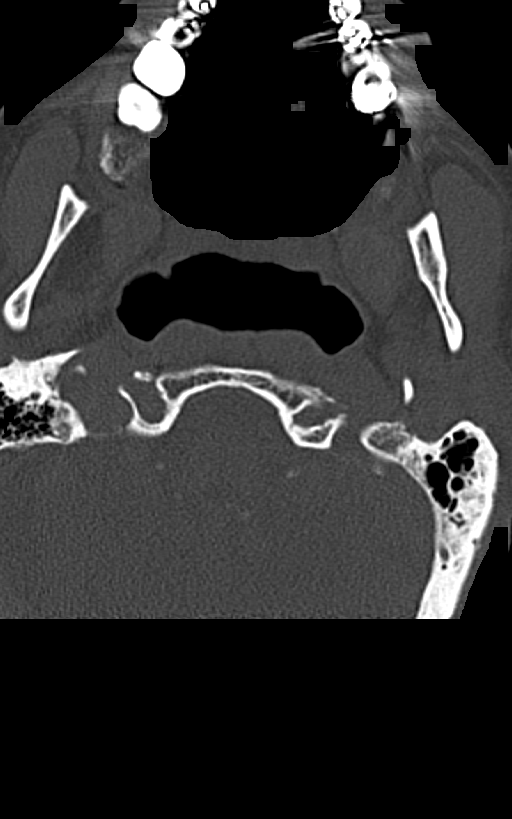

[Series 8: sagittal bone · sagittal · 0.31mm/px · 5 of 61 slices shown, 6 images]
[im 21/61  bone]
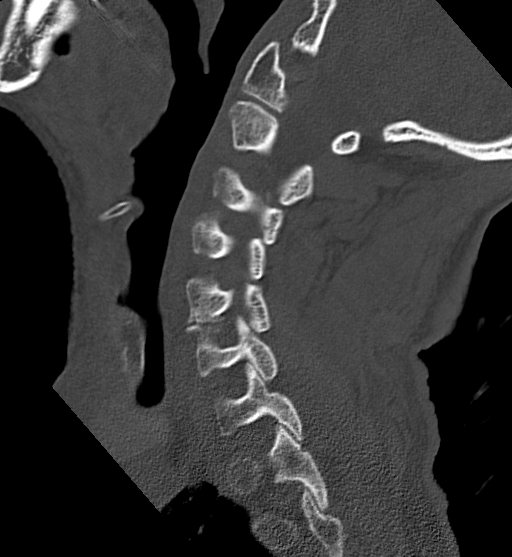
[im 26/61  bone]
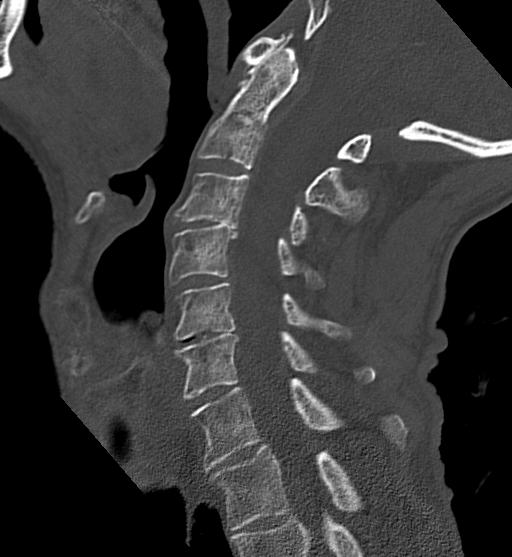
[im 31/61  soft-tissue]
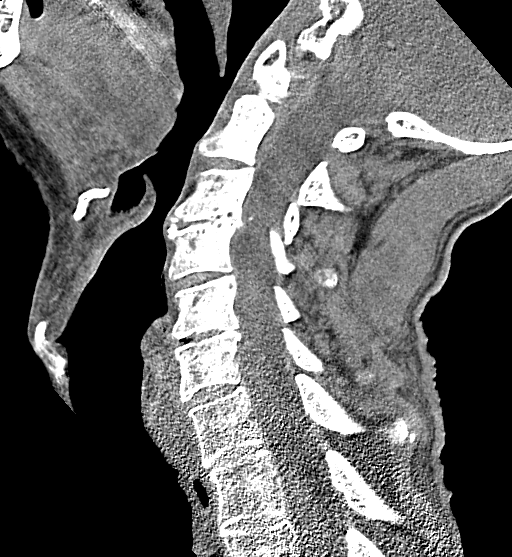
[im 31/61  bone]
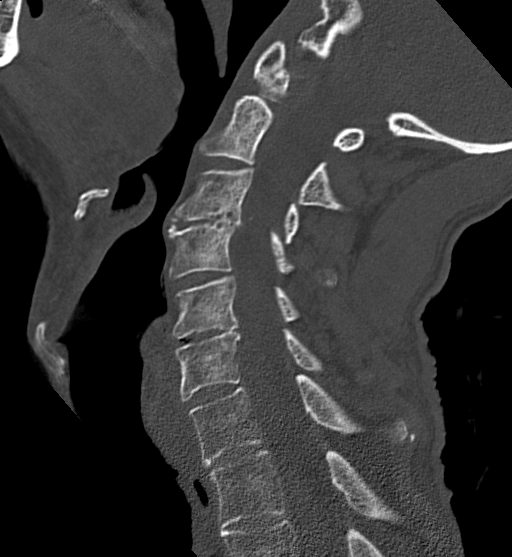
[im 36/61  bone]
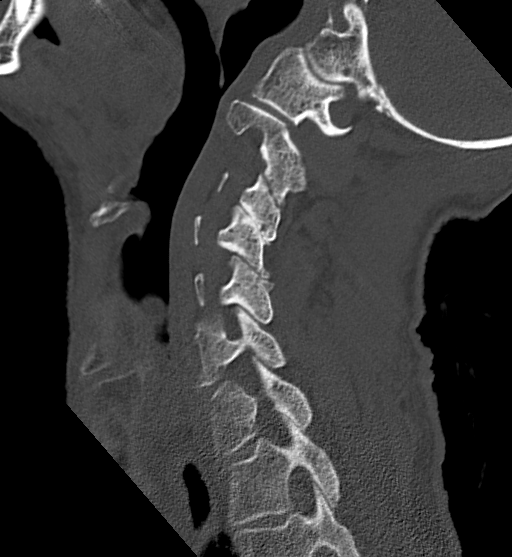
[im 41/61  bone]
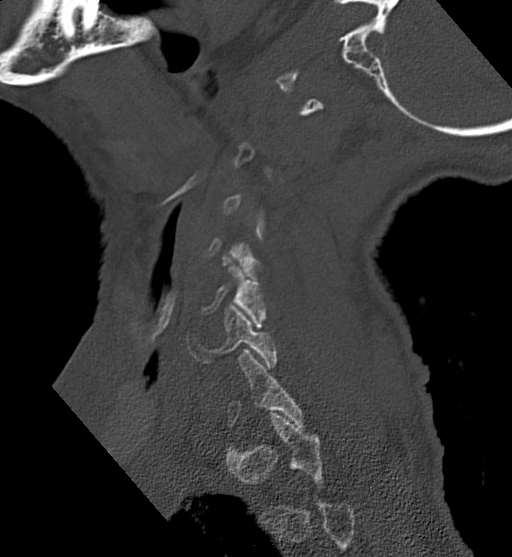

[11 of 27 positions shown; findings below may reference images not displayed]

FINDINGS: Alignment: Trace retrolisthesis at C3-C4.

Skull base and vertebrae: Vertebral body heights are maintained. No
acute fracture.

Soft tissues and spinal canal: No prevertebral fluid or swelling. No
visible canal hematoma.

Disc levels: Multilevel degenerative changes are present including
disc space narrowing, endplate osteophytes, and facet and
uncovertebral hypertrophy. No significant canal stenosis.

Upper chest: No apical lung mass.

Other: Calcified plaque at the common carotid bifurcations.
IMPRESSION: No acute cervical spine fracture.

## 2021-12-23 IMAGING — CT CT HEAD W/O CM
3 series · 16 of 47 positions shown, 19 images · non-contrast
Comparison: None.

CLINICAL DATA: Head trauma, minor; fell and hit head

EXAM:
CT HEAD WITHOUT CONTRAST
TECHNIQUE: Contiguous axial images were obtained from the base of the skull
through the vertex without intravenous contrast.

[Series 2: head wo · axial · 0.39mm/px · z∈[+1312,+1447]mm · 10 of 33 slices shown, 13 images]
[im 3/33  brain]
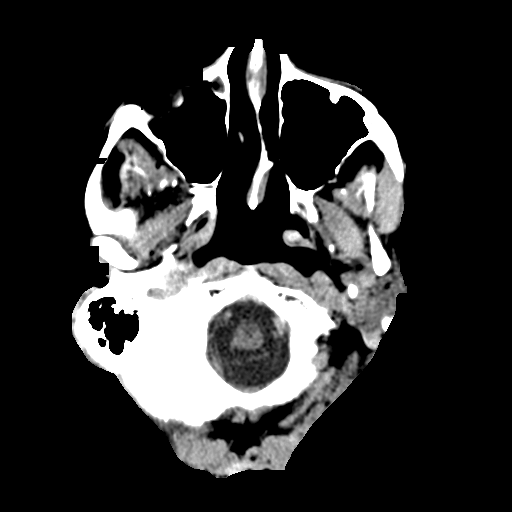
[im 3/33  bone]
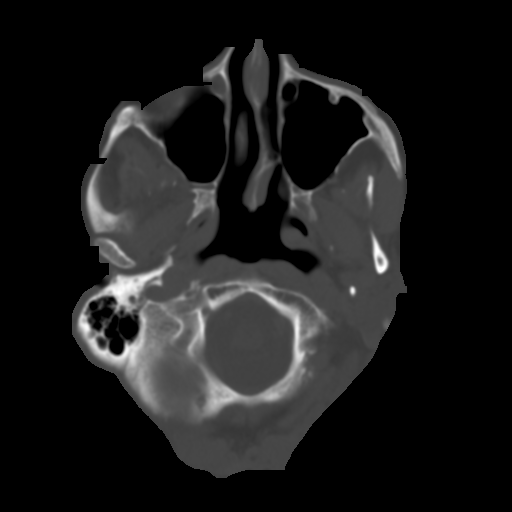
[im 6/33  brain]
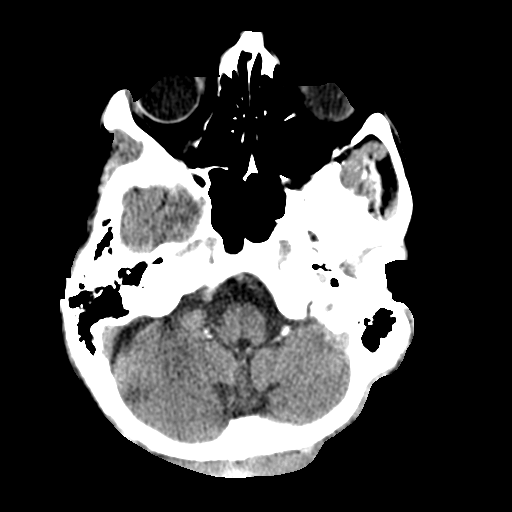
[im 9/33  brain]
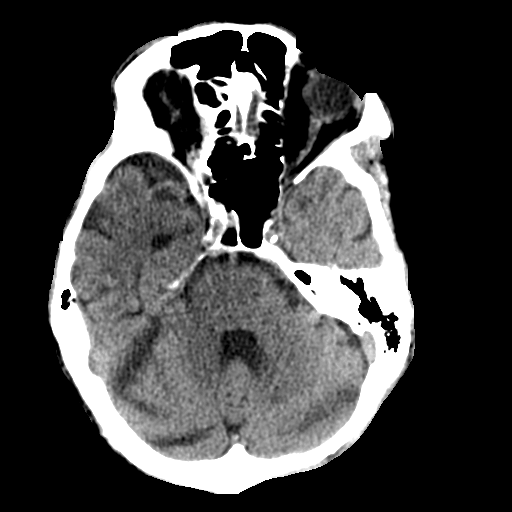
[im 12/33  brain]
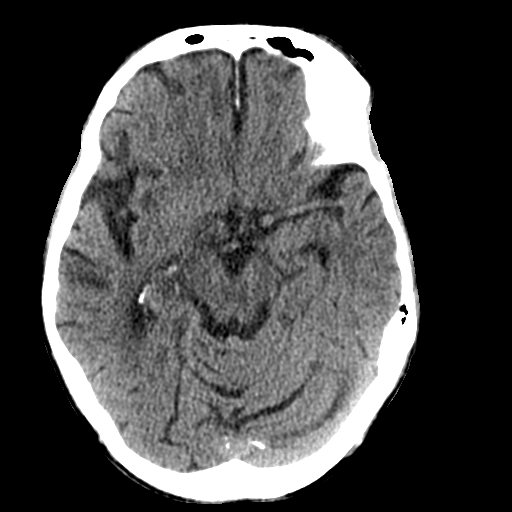
[im 15/33  brain]
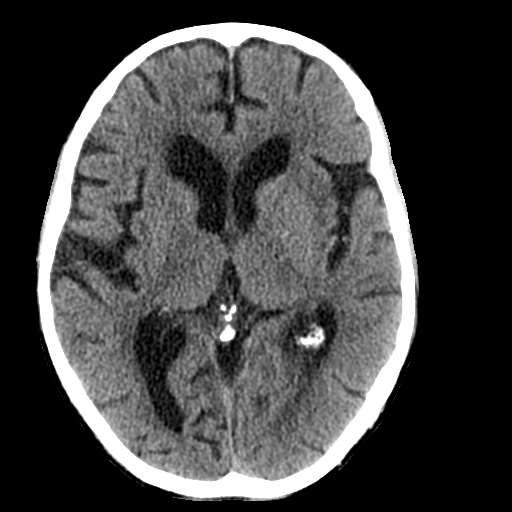
[im 15/33  bone]
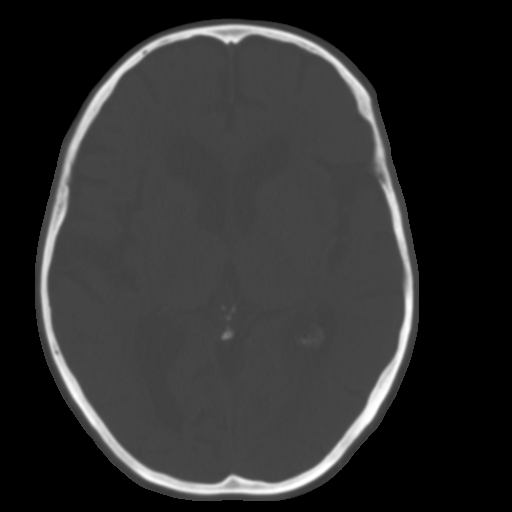
[im 18/33  brain]
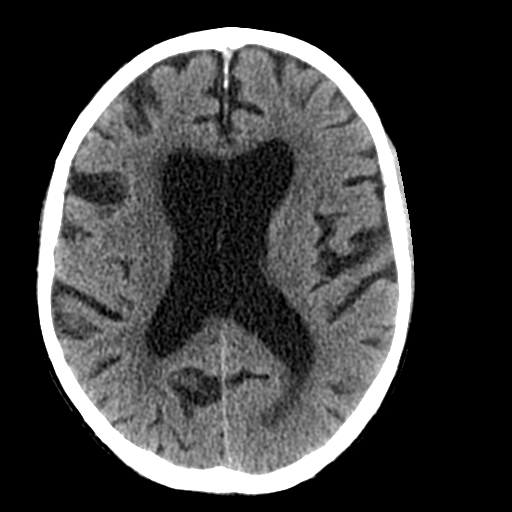
[im 21/33  brain]
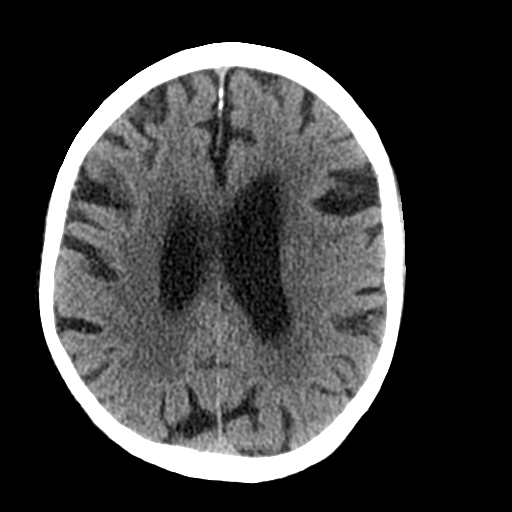
[im 25/33  brain]
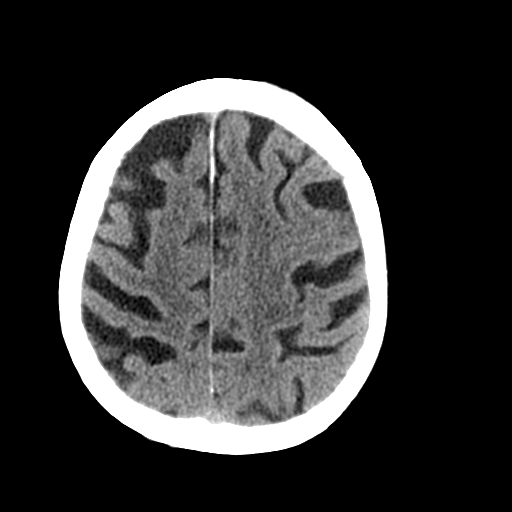
[im 27/33  brain]
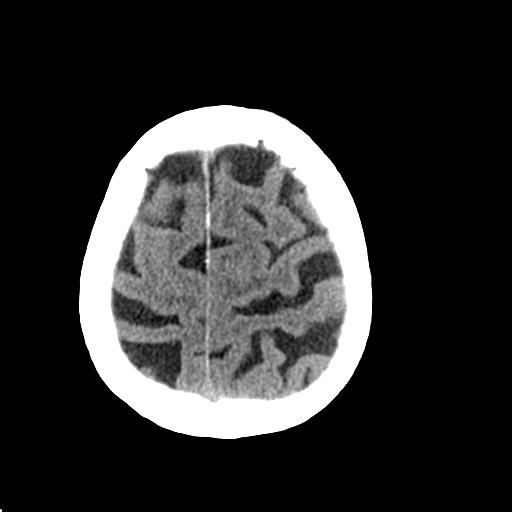
[im 27/33  bone]
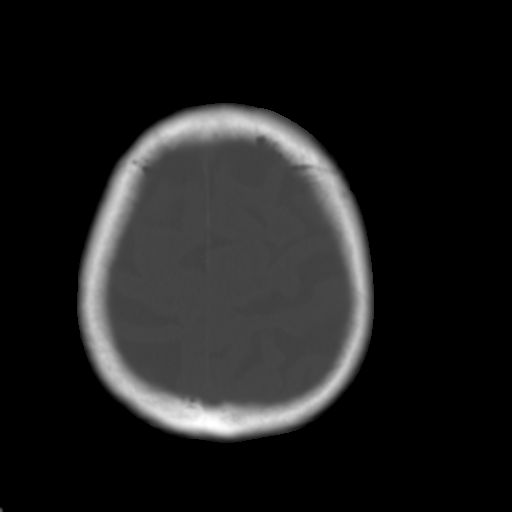
[im 30/33  brain]
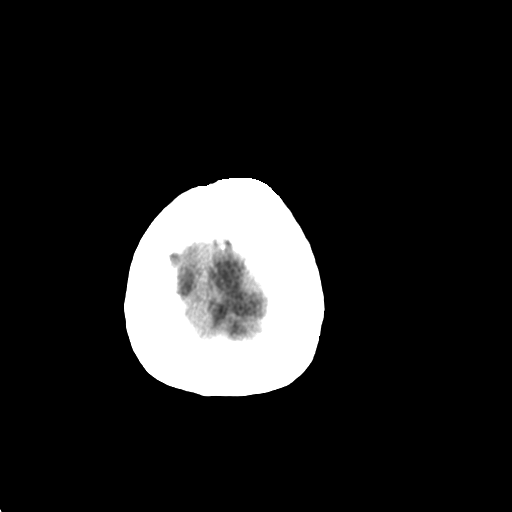

[Series 4: coronal soft tissue · coronal · 0.35mm/px · 3 of 72 slices shown]
[im 24/72  brain]
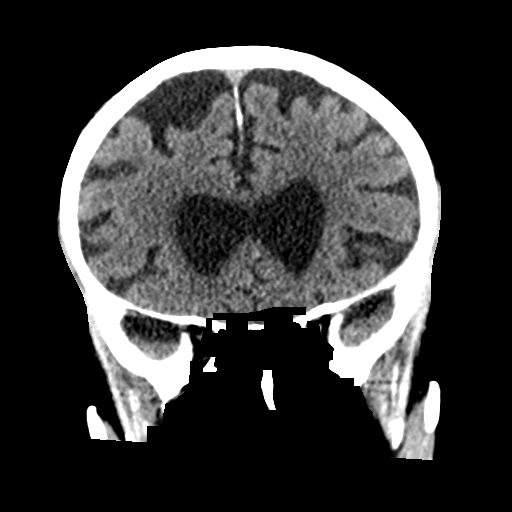
[im 32/72  brain]
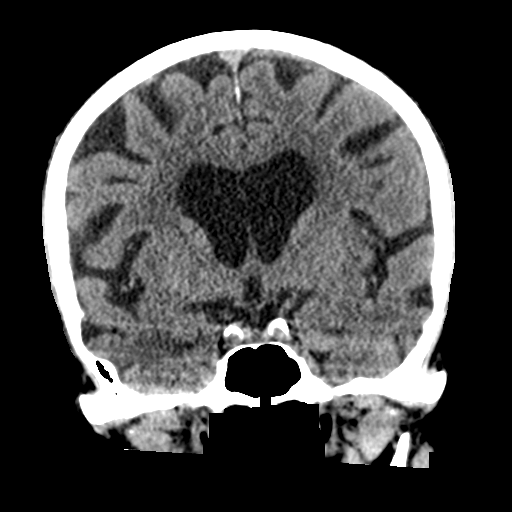
[im 40/72  brain]
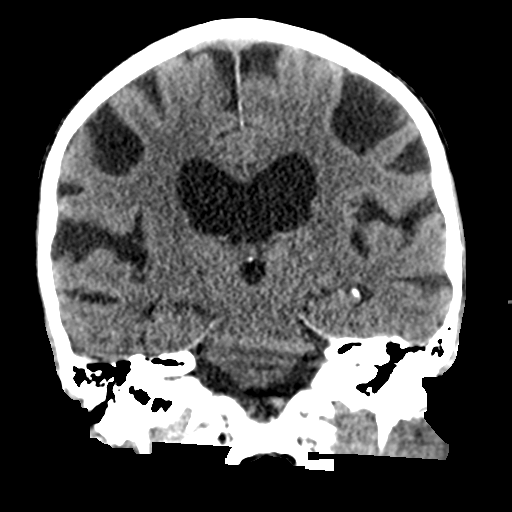

[Series 5: sagittal soft tissue · sagittal · 0.35mm/px · 3 of 59 slices shown]
[im 20/59  brain]
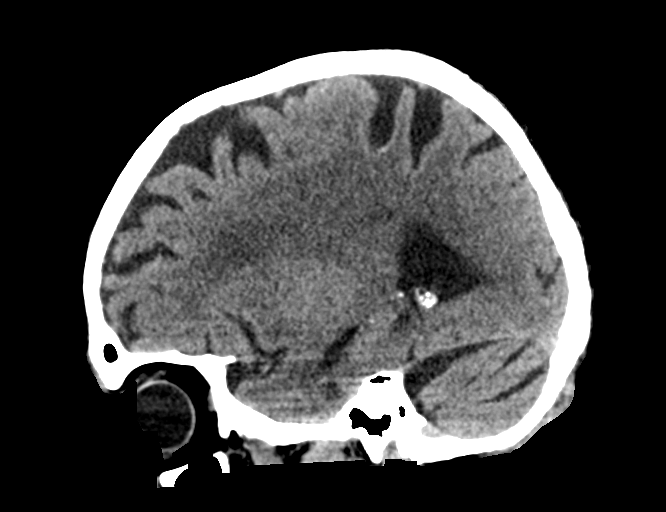
[im 30/59  brain]
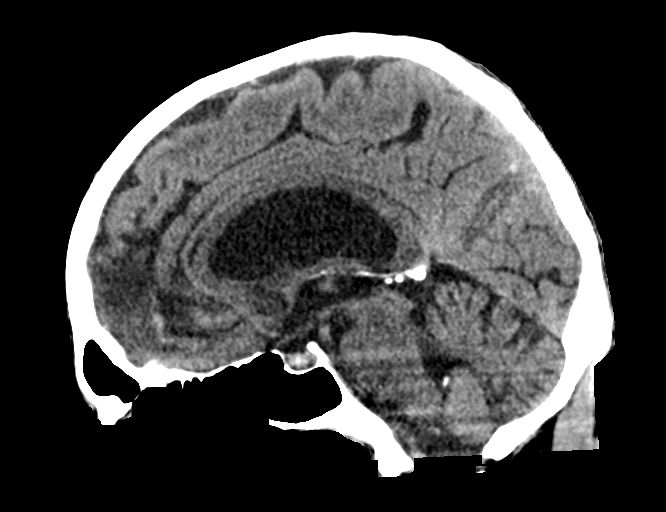
[im 39/59  brain]
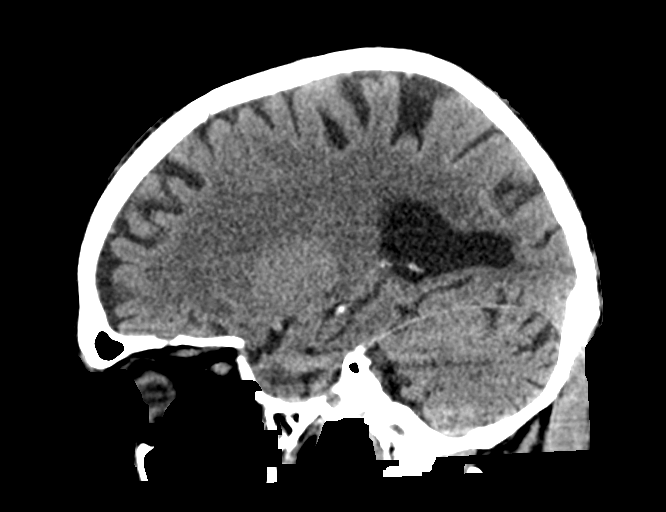

[16 of 47 positions shown; findings below may reference images not displayed]

FINDINGS: Brain: There is no acute intracranial hemorrhage, mass effect, or
edema. Gray-white differentiation is preserved. There is no
extra-axial fluid collection. Patchy low-attenuation in the
supratentorial white matter is nonspecific but probably reflects
chronic microvascular ischemic changes. Prominence of the ventricles
and sulci reflects generalized parenchymal volume loss.

Vascular: There is atherosclerotic calcification at the skull base.

Skull: Calvarium is unremarkable.

Sinuses/Orbits: No acute finding.

Other: None.
IMPRESSION: No evidence of acute intracranial injury.
